# Patient Record
Sex: Male | Born: 1950 | Race: Black or African American | Hispanic: No | Marital: Single | Smoking: Former smoker
Health system: Southern US, Community
[De-identification: ages and names within clinical notes are randomized; demographics above are authoritative.]

## PROBLEM LIST (undated history)

## (undated) DIAGNOSIS — Z720 Tobacco use: Secondary | ICD-10-CM

## (undated) DIAGNOSIS — T7840XA Allergy, unspecified, initial encounter: Secondary | ICD-10-CM

## (undated) DIAGNOSIS — R7303 Prediabetes: Secondary | ICD-10-CM

## (undated) DIAGNOSIS — N400 Enlarged prostate without lower urinary tract symptoms: Secondary | ICD-10-CM

## (undated) DIAGNOSIS — I10 Essential (primary) hypertension: Secondary | ICD-10-CM

## (undated) HISTORY — DX: Benign prostatic hyperplasia without lower urinary tract symptoms: N40.0

## (undated) HISTORY — DX: Allergy, unspecified, initial encounter: T78.40XA

## (undated) HISTORY — DX: Prediabetes: R73.03

## (undated) HISTORY — DX: Tobacco use: Z72.0

---

## 2015-10-19 ENCOUNTER — Encounter (HOSPITAL_COMMUNITY): Payer: Self-pay | Admitting: Emergency Medicine

## 2015-10-19 ENCOUNTER — Observation Stay (HOSPITAL_COMMUNITY)
Admission: EM | Admit: 2015-10-19 | Discharge: 2015-10-20 | Disposition: A | Discharge status: Home / Self Care | Payer: Self-pay | Attending: Internal Medicine | Admitting: Internal Medicine

## 2015-10-19 ENCOUNTER — Emergency Department (HOSPITAL_COMMUNITY): Payer: Self-pay

## 2015-10-19 DIAGNOSIS — F17210 Nicotine dependence, cigarettes, uncomplicated: Secondary | ICD-10-CM | POA: Insufficient documentation

## 2015-10-19 DIAGNOSIS — I10 Essential (primary) hypertension: Secondary | ICD-10-CM | POA: Insufficient documentation

## 2015-10-19 DIAGNOSIS — N289 Disorder of kidney and ureter, unspecified: Secondary | ICD-10-CM | POA: Insufficient documentation

## 2015-10-19 DIAGNOSIS — N3000 Acute cystitis without hematuria: Secondary | ICD-10-CM | POA: Insufficient documentation

## 2015-10-19 DIAGNOSIS — R55 Syncope and collapse: Principal | ICD-10-CM | POA: Insufficient documentation

## 2015-10-19 DIAGNOSIS — R000 Tachycardia, unspecified: Secondary | ICD-10-CM | POA: Insufficient documentation

## 2015-10-19 HISTORY — DX: Essential (primary) hypertension: I10

## 2015-10-19 LAB — CBC WITH DIFFERENTIAL/PLATELET
Basophils Absolute: 0 10*3/uL (ref 0.0–0.1)
Basophils Relative: 0 %
EOS ABS: 0 10*3/uL (ref 0.0–0.7)
EOS PCT: 1 %
HCT: 36.3 % — ABNORMAL LOW (ref 39.0–52.0)
Hemoglobin: 12.2 g/dL — ABNORMAL LOW (ref 13.0–17.0)
LYMPHS ABS: 0.9 10*3/uL (ref 0.7–4.0)
Lymphocytes Relative: 11 %
MCH: 30 pg (ref 26.0–34.0)
MCHC: 33.6 g/dL (ref 30.0–36.0)
MCV: 89.4 fL (ref 78.0–100.0)
MONO ABS: 0.4 10*3/uL (ref 0.1–1.0)
MONOS PCT: 6 %
Neutro Abs: 6.6 10*3/uL (ref 1.7–7.7)
Neutrophils Relative %: 83 %
PLATELETS: 128 10*3/uL — AB (ref 150–400)
RBC: 4.06 MIL/uL — AB (ref 4.22–5.81)
RDW: 14.5 % (ref 11.5–15.5)
WBC: 8 10*3/uL (ref 4.0–10.5)

## 2015-10-19 LAB — COMPREHENSIVE METABOLIC PANEL
ALK PHOS: 76 U/L (ref 38–126)
ALT: 54 U/L (ref 17–63)
AST: 120 U/L — AB (ref 15–41)
Albumin: 4 g/dL (ref 3.5–5.0)
Anion gap: 15 (ref 5–15)
BUN: 20 mg/dL (ref 6–20)
CALCIUM: 8.9 mg/dL (ref 8.9–10.3)
CO2: 27 mmol/L (ref 22–32)
CREATININE: 1.8 mg/dL — AB (ref 0.61–1.24)
Chloride: 96 mmol/L — ABNORMAL LOW (ref 101–111)
GFR calc non Af Amer: 38 mL/min — ABNORMAL LOW (ref >60)
GFR, EST AFRICAN AMERICAN: 44 mL/min — AB (ref >60)
Glucose, Bld: 147 mg/dL — ABNORMAL HIGH (ref 65–99)
Potassium: 3 mmol/L — ABNORMAL LOW (ref 3.5–5.1)
SODIUM: 138 mmol/L (ref 135–145)
Total Bilirubin: 1.2 mg/dL (ref 0.3–1.2)
Total Protein: 7.8 g/dL (ref 6.5–8.1)

## 2015-10-19 LAB — RAPID URINE DRUG SCREEN, HOSP PERFORMED
Amphetamines: NOT DETECTED
Barbiturates: NOT DETECTED
Benzodiazepines: NOT DETECTED
Cocaine: NOT DETECTED
OPIATES: NOT DETECTED
Tetrahydrocannabinol: NOT DETECTED

## 2015-10-19 LAB — URINALYSIS, ROUTINE W REFLEX MICROSCOPIC
Glucose, UA: NEGATIVE mg/dL
Ketones, ur: 15 mg/dL — AB
NITRITE: POSITIVE — AB
PH: 5 (ref 5.0–8.0)
Protein, ur: 100 mg/dL — AB
SPECIFIC GRAVITY, URINE: 1.027 (ref 1.005–1.030)

## 2015-10-19 LAB — URINE MICROSCOPIC-ADD ON

## 2015-10-19 LAB — TROPONIN I: Troponin I: <0.03 ng/mL (ref <0.031)

## 2015-10-19 MED ORDER — SODIUM CHLORIDE 0.9 % IV BOLUS (SEPSIS)
500.0000 mL | Freq: Once | INTRAVENOUS | Status: AC
  Administered 2015-10-19: 500 mL via INTRAVENOUS

## 2015-10-19 MED ORDER — DEXTROSE 5 % IV SOLN
1.0000 g | Freq: Once | INTRAVENOUS | Status: AC
  Administered 2015-10-19: 1 g via INTRAVENOUS
  Filled 2015-10-19: qty 10

## 2015-10-19 MED ORDER — POTASSIUM CHLORIDE CRYS ER 20 MEQ PO TBCR
40.0000 meq | EXTENDED_RELEASE_TABLET | Freq: Once | ORAL | Status: AC
  Administered 2015-10-19: 40 meq via ORAL
  Filled 2015-10-19: qty 2

## 2015-10-19 MED ORDER — SODIUM CHLORIDE 0.9 % IV BOLUS (SEPSIS)
1000.0000 mL | Freq: Once | INTRAVENOUS | Status: AC
  Administered 2015-10-19: 1000 mL via INTRAVENOUS

## 2015-10-19 NOTE — ED Notes (Signed)
Pt could not provide a sample on second try

## 2015-10-19 NOTE — H&P (Signed)
Date: 10/19/2015               Patient Name:  Patrick Thompson MRN: [id number]  DOB: 1951/03/06 Age / Sex: 65 y.o., male   PCP: No Pcp Per Patient         Medical Service: Internal Medicine Teaching Service         Attending Physician: Dr. Davonna Belling, MD    First Contact: Dr. Liberty Handy Pager: [phone]  Second Contact: Dr. Julious Oka Pager: [phone]       After Hours (After 5p/  First Contact Pager: [phone]  weekends / holidays): Second Contact Pager: [phone]   Chief Complaint: "I passed out."  History of Present Illness:  Patrick Thompson is a 65 year old man with no medical problems here with pre-syncope.  Earlier today while waiting in line at [employer], he felt himself becoming lightheaded, tried to make it to a chair, and was eased down by another customer. He never fully lost consciousness but did feel very lightheaded. He denied any preceding chest pain, shortness of breath, palpitations, or cough. He further denies any stable angina, dyspnea on exertion, or history of seizures. He says his right flank started hurting today after his syncopal episode but he denies fevers, dysuria, urgency, or frequency; review of systems is otherwise non-revealing.  Meds: Current Facility-Administered Medications  Medication Dose Route Frequency Provider Last Rate Last Dose  . cefTRIAXone (ROCEPHIN) 1 g in dextrose 5 % 50 mL IVPB  1 g Intravenous Once Davonna Belling, MD 100 mL/hr at 10/19/15 2214 1 g at 10/19/15 2214   No current outpatient prescriptions on file.    Allergies: Allergies as of 10/19/2015  . (No Known Allergies)   Past Medical History  Diagnosis Date  . Hypertension     No longer on medication   History reviewed. No pertinent past surgical history. Family History  Problem Relation Age of Onset  . Hypertension Mother   . Cancer Father     Prostate   . Heart disease Brother     55 brother, MI in late 59s   Social History   Social History  . Marital  Status: Single    Spouse Name: N/A  . Number of Children: N/A  . Years of Education: N/A   Occupational History  . Not on file.   Social History Main Topics  . Smoking status: Current Some Day Smoker -- 0.10 packs/day for 40 years    Types: Cigarettes  . Smokeless tobacco: Not on file  . Alcohol Use: 0.0 oz/week    0 Standard drinks or equivalent per week     Comment: occasionally  . Drug Use: No  . Sexual Activity: Not on file   Other Topics Concern  . Not on file   Social History Narrative   Lives in [place]: Per HPI  Physical Exam: Blood pressure 130/96, pulse 87, temperature 98.7 F (37.1 C), temperature source Oral, resp. rate 22, SpO2 98 %.  Orthostatics not obtained General: friendly man resting in bed comfortably, appropriately conversational HEENT: no scleral icterus, extra-ocular muscles intact, oropharynx without lesions Cardiac: regular rate and rhythm, no rubs, murmurs or gallops Pulm: breathing well, clear to auscultation bilaterally Abd: bowel sounds normal, soft, nondistended, non-tender, CVA tenderness on right side Ext: warm and well perfused, without pedal edema, no hair on lower extremities with 1+ DPs bilaterally Lymph: no cervical or supraclavicular lymphadenopathy Skin: no rash, hair, or nail changes Neuro: alert  and oriented X3, cranial nerves II-XII grossly intact, moving all extremities well, action tremor bilatearlly  Lab results: Basic Metabolic Panel:  Recent Labs  10/19/15 1859  NA 138  K 3.0*  CL 96*  CO2 27  GLUCOSE 147*  BUN 20  CREATININE 1.80*  CALCIUM 8.9   Liver Function Tests:  Recent Labs  10/19/15 1859  AST 120*  ALT 54  ALKPHOS 76  BILITOT 1.2  PROT 7.8  ALBUMIN 4.0   CBC:  Recent Labs  10/19/15 1859  WBC 8.0  NEUTROABS 6.6  HGB 12.2*  HCT 36.3*  MCV 89.4  PLT 128*   Cardiac Enzymes:  Recent Labs  10/19/15 1859  TROPONINI <0.03   Urine Drug Screen: Drugs of Abuse       Component Value Date/Time   LABOPIA NONE DETECTED 10/19/2015 2119   COCAINSCRNUR NONE DETECTED 10/19/2015 2119   LABBENZ NONE DETECTED 10/19/2015 2119   AMPHETMU NONE DETECTED 10/19/2015 2119   THCU NONE DETECTED 10/19/2015 2119   LABBARB NONE DETECTED 10/19/2015 2119    Urinalysis:  Recent Labs  10/19/15 2105  COLORURINE AMBER*  LABSPEC 1.027  PHURINE 5.0  GLUCOSEU NEGATIVE  HGBUR SMALL*  BILIRUBINUR LARGE*  KETONESUR 15*  PROTEINUR 100*  NITRITE POSITIVE*  LEUKOCYTESUR SMALL*    Imaging results:  Dg Chest 2 View  10/19/2015  CLINICAL DATA:  Syncope. EXAM: CHEST  2 VIEW COMPARISON:  None. FINDINGS: Both lungs are clear. Heart and mediastinum are within normal limits. The trachea is midline. No acute bone abnormality. No pleural effusions. Evidence for scarring at the lung apices. IMPRESSION: No active cardiopulmonary disease. Electronically Signed   By: Markus Daft M.D.   On: 10/19/2015 19:41   Other results: EKG: normal sinus rhythm with no ischemic changes  Assessment & Plan by Problem:  Patrick Thompson is a 65 year old man with no medical problems here with pre-syncope and a complicated urinary tract infection.  Pre-syncope: This appears to be simple orthostatic hypotension probably related to his urinary tract infection. I doubt myocardial infarction, arrhythmia, valvular defect, or seizure. -Bolused 1.5L in ED -Started NS at 111mL/hr -Telemetry overnight  Complicated urinary tract infection: He complains of dull right-sided flank pain with CVA tenderness but denies fevers, dysuria, urgency, or frequency. His urinalysis does show bacteriuria but I have to wonder whether it is clinically significant. Given his flank pain, pre-syncope, and the fact that he is a male, we will err on the side of treatment for a 7 day course. -Received IV ceftriaxone in the ED -Can consider started ciprofloxacin for a 5-7 day course upon discharge  Normocytic anemia: He hemoglobin is 12.2  with a normal MCV; there are no prior CBCs to compare. He denies melena nor hematochezia. Since he is 65, he should get a colonoscopy on an outpatient basis. -Will need outpatient colonoscopy  Decreased renal function: His creatinine is 1.8 with normal BUN; it's hard to say whether this is chronic kidney disease or pre-renal azotemia so we will check another BMP in the morning after he was re-hydrated. -BMP in the morning  Claudication, Rutherford stage 2: He complains of claudication when walking up one flight of stairs, has hairless lower extremities, and weak posterior tibial pulses. -Recommend outpatient ABIs  Transaminitis: His AST is 120, ALT 54, likely from alcohol, although he only drinks about 5 drinks weekly. -Follow-up CMP on outpatient basis -Can consider screening for HCV  Tobacco abuse: He smokes 1 cigarette daily for the last 40 years. -  Advised total cessation  Hypokalemia: Potassium was 3.0, likely from dehydration, and was replaced. 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  Dispo: Disposition is deferred at this time, awaiting improvement of current medical problems. Anticipated discharge in approximately 1 day(s).   The patient does not have a current PCP (No Pcp Per Patient) and does need an [facility] hospital follow-up appointment after discharge; I think he would be a good patient for our clinic downstairs.  The patient does have transportation limitations that hinder transportation to clinic appointments.  Signed: Loleta Chance, MD 10/19/2015, 10:23 PM

## 2015-10-19 NOTE — ED Notes (Signed)
Pt to ER BIB [facility] after witnessed syncopal episode while standing in check out line at [employer]. Pt was caught before hitting the ground. Pt denies pain at this time but does report that he was nauseous before syncopal episode. Pt reported to be confused by EMS. Pt answering all questions appropriately at present. Pt noted to be tachycardic at 110. Denies chest pain, denies shortness of breath. NAD.

## 2015-10-19 NOTE — ED Provider Notes (Signed)
CSN: [id number]     Arrival date & time 10/19/15  1806 History   First MD Initiated Contact with Patient 10/19/15 1820     Chief Complaint  Patient presents with  . Loss of Consciousness      Patient is a 65 y.o. male presenting with syncope. The history is provided by the patient.  Loss of Consciousness Associated symptoms: nausea   Associated symptoms: no chest pain, no headaches, no shortness of breath and no weakness   Patient had a syncopal episode. States he was at the grocery store and does not really remember much of it. Per EMS notes he was at the checkout line and passed out. Patient states he has felt nauseous. He has not eaten much today. No chest pain. No trouble breathing. No headache. Did not hit his head. It is not really remember the event. States he feels better now.  Past Medical History  Diagnosis Date  . Hypertension     No longer on medication   History reviewed. No pertinent past surgical history. Family History  Problem Relation Age of Onset  . Hypertension Mother   . Cancer Father     Prostate   . Heart disease Brother     32 brother, MI in late 39s   Social History  Substance Use Topics  . Smoking status: Current Some Day Smoker -- 0.10 packs/day for 40 years    Types: Cigarettes  . Smokeless tobacco: None  . Alcohol Use: 0.0 oz/week    0 Standard drinks or equivalent per week     Comment: occasionally    Review of Systems  Constitutional: Negative for activity change, appetite change and fatigue.  Eyes: Negative for pain.  Respiratory: Negative for chest tightness and shortness of breath.   Cardiovascular: Positive for syncope. Negative for chest pain and leg swelling.  Gastrointestinal: Positive for nausea. Negative for abdominal pain and diarrhea.  Genitourinary: Negative for flank pain.  Musculoskeletal: Negative for back pain and neck stiffness.  Skin: Negative for rash.  Neurological: Positive for syncope. Negative for weakness,  numbness and headaches.  Psychiatric/Behavioral: Negative for behavioral problems.      Allergies  Review of patient's allergies indicates no known allergies.  Home Medications   Prior to Admission medications   Not on File   BP 136/98 mmHg  Pulse 90  Temp(Src) 98.7 F (37.1 C) (Oral)  Resp 15  SpO2 96% Physical Exam  Constitutional: He appears well-developed.  HENT:  Head: Atraumatic.  Eyes: EOM are normal.  Neck: Neck supple.  Cardiovascular:  Mild tachycardia  Pulmonary/Chest: Effort normal.  Abdominal: Soft. There is no tenderness.  Neurological: He is alert.  Patient is awake and pleasant, but mildly confused to the month.  Skin: Skin is warm.  Psychiatric: He has a normal mood and affect.    ED Course  Procedures (including critical care time) Labs Review Labs Reviewed  COMPREHENSIVE METABOLIC PANEL - Abnormal; Notable for the following:    Potassium 3.0 (*)    Chloride 96 (*)    Glucose, Bld 147 (*)    Creatinine, Ser 1.80 (*)    AST 120 (*)    GFR calc non Af Amer 38 (*)    GFR calc Af Amer 44 (*)    All other components within normal limits  CBC WITH DIFFERENTIAL/PLATELET - Abnormal; Notable for the following:    RBC 4.06 (*)    Hemoglobin 12.2 (*)    HCT 36.3 (*)  Platelets 128 (*)    All other components within normal limits  URINALYSIS, ROUTINE W REFLEX MICROSCOPIC (NOT AT [facility]) - Abnormal; Notable for the following:    Color, Urine AMBER (*)    APPearance CLOUDY (*)    Hgb urine dipstick SMALL (*)    Bilirubin Urine LARGE (*)    Ketones, ur 15 (*)    Protein, ur 100 (*)    Nitrite POSITIVE (*)    Leukocytes, UA SMALL (*)    All other components within normal limits  URINE MICROSCOPIC-ADD ON - Abnormal; Notable for the following:    Squamous Epithelial / LPF 0-5 (*)    Bacteria, UA MANY (*)    Casts HYALINE CASTS (*)    All other components within normal limits  URINE CULTURE  TROPONIN I  URINE RAPID DRUG SCREEN, HOSP PERFORMED     Imaging Review Dg Chest 2 View  10/19/2015  CLINICAL DATA:  Syncope. EXAM: CHEST  2 VIEW COMPARISON:  None. FINDINGS: Both lungs are clear. Heart and mediastinum are within normal limits. The trachea is midline. No acute bone abnormality. No pleural effusions. Evidence for scarring at the lung apices. IMPRESSION: No active cardiopulmonary disease. Electronically Signed   By: Markus Daft M.D.   On: 10/19/2015 19:41   I have personally reviewed and evaluated these images and lab results as part of my medical decision-making.   EKG Interpretation   Date/Time:  Wednesday October 19 2015 18:12:34 EDT Ventricular Rate:  107 PR Interval:  160 QRS Duration: 79 QT Interval:  324 QTC Calculation: 432 R Axis:   68 Text Interpretation:  Sinus tachycardia Borderline repolarization  abnormality Confirmed by Alvino Chapel  MD, Ovid Curd [phone]) on 10/19/2015  6:33:38 PM      MDM   Final diagnoses:  Syncope, unspecified syncope type  Renal insufficiency  Acute cystitis without hematuria    Patient was syncope. Also renal insufficiency. Unsure of baseline kidney function. Found also to have urinary tract infection. Will admit for further evaluation treatment.    Davonna Belling, MD 10/20/15 [phone]

## 2015-10-19 NOTE — ED Notes (Signed)
Pt tried and stated he could not urinate. Will attempt again to collect sample

## 2015-10-19 NOTE — ED Notes (Signed)
Patient transported to X-ray 

## 2015-10-19 NOTE — ED Notes (Signed)
MDs at bedside

## 2015-10-20 DIAGNOSIS — R55 Syncope and collapse: Secondary | ICD-10-CM

## 2015-10-20 DIAGNOSIS — F1010 Alcohol abuse, uncomplicated: Secondary | ICD-10-CM

## 2015-10-20 DIAGNOSIS — R740 Nonspecific elevation of levels of transaminase and lactic acid dehydrogenase [LDH]: Secondary | ICD-10-CM

## 2015-10-20 DIAGNOSIS — N179 Acute kidney failure, unspecified: Secondary | ICD-10-CM

## 2015-10-20 DIAGNOSIS — I951 Orthostatic hypotension: Secondary | ICD-10-CM

## 2015-10-20 DIAGNOSIS — E876 Hypokalemia: Secondary | ICD-10-CM

## 2015-10-20 HISTORY — DX: Syncope and collapse: R55

## 2015-10-20 LAB — BASIC METABOLIC PANEL
ANION GAP: 11 (ref 5–15)
BUN: 20 mg/dL (ref 6–20)
CALCIUM: 7.9 mg/dL — AB (ref 8.9–10.3)
CO2: 26 mmol/L (ref 22–32)
Chloride: 101 mmol/L (ref 101–111)
Creatinine, Ser: 1.28 mg/dL — ABNORMAL HIGH (ref 0.61–1.24)
GFR calc Af Amer: >60 mL/min (ref >60)
GFR, EST NON AFRICAN AMERICAN: 58 mL/min — AB (ref >60)
GLUCOSE: 104 mg/dL — AB (ref 65–99)
Potassium: 3.1 mmol/L — ABNORMAL LOW (ref 3.5–5.1)
Sodium: 138 mmol/L (ref 135–145)

## 2015-10-20 LAB — ETHANOL

## 2015-10-20 LAB — MAGNESIUM: Magnesium: 0.8 mg/dL — CL (ref 1.7–2.4)

## 2015-10-20 MED ORDER — SODIUM CHLORIDE 0.9% FLUSH: 3.0000 mL | Freq: Two times a day (BID) | INTRAVENOUS | Status: DC

## 2015-10-20 MED ORDER — POTASSIUM CHLORIDE CRYS ER 20 MEQ PO TBCR
40.0000 meq | EXTENDED_RELEASE_TABLET | Freq: Two times a day (BID) | ORAL | Status: DC
  Administered 2015-10-20: 40 meq via ORAL
  Filled 2015-10-20: qty 2

## 2015-10-20 MED ORDER — MAGNESIUM SULFATE 4 GM/100ML IV SOLN
4.0000 g | Freq: Once | INTRAVENOUS | Status: AC
  Administered 2015-10-20: 4 g via INTRAVENOUS
  Filled 2015-10-20: qty 100

## 2015-10-20 MED ORDER — DICLOFENAC SODIUM 1 % TD GEL
2.0000 g | Freq: Four times a day (QID) | TRANSDERMAL | Status: DC
  Filled 2015-10-20 (×2): qty 100

## 2015-10-20 MED ORDER — SODIUM CHLORIDE 0.9 % IV SOLN
INTRAVENOUS | Status: DC
  Administered 2015-10-20: 12:00:00 via INTRAVENOUS

## 2015-10-20 MED ORDER — ENOXAPARIN SODIUM 40 MG/0.4ML ~~LOC~~ SOLN
40.0000 mg | SUBCUTANEOUS | Status: DC
  Filled 2015-10-20: qty 0.4

## 2015-10-20 MED ORDER — DICLOFENAC SODIUM 1 % TD GEL: 2.0000 g | Freq: Four times a day (QID) | TRANSDERMAL | Status: DC

## 2015-10-20 NOTE — Discharge Planning (Signed)
Chakita Mcgraw J. Clydene Laming, RN, BSN, [place] [phone] [facility] set up appointment with Cammie Sickle, FNP on 7/3 @ 0900.  Pt verbalizes understanding of keeping appointment.

## 2015-10-20 NOTE — Progress Notes (Signed)
Subjective:  Mr. Yoffe says he feels much better this morning. He denies any light-headedness. He added some history, saying that he in fact felt light-headed and lost consciousness. He believes his right sided flank pain is due to his fall and is worse when he twists his back. He continues to deny dysuria or any acute changes in urinary frequency.  He reports that he had been drinking gin the day.  He says his PCP died 6 months ago, and he does not regularly follow with a doctor.  Objective: Vital signs in last 24 hours: Filed Vitals:   10/20/15 0700 10/20/15 0730 10/20/15 0800 10/20/15 0830  BP: 153/97 144/105 158/109 175/111  Pulse: 76 73 72 87  Temp:      TempSrc:      Resp: 20 17 13 14   SpO2: 98% 99% 96% 97%   Weight change:  No intake or output data in the 24 hours ending 10/20/15 0912 General: Sitting up in bed, NAD HEENT: MMM. Neck supple Cardiovascular: RRR, no m/r/g Pulmonary: CTAB, unlabored breathing Abdominal: Soft. NT/ND. MSK: Paraspinal tenderness on right. No atrophy Neurological: Tremulous. Normal FTN  Lab Results: Basic Metabolic Panel:  Recent Labs Lab 10/19/15 1859 10/20/15 0736  NA 138 138  K 3.0* 3.1*  CL 96* 101  CO2 27 26  GLUCOSE 147* 104*  BUN 20 20  CREATININE 1.80* 1.28*  CALCIUM 8.9 7.9*   Liver Function Tests:  Recent Labs Lab 10/19/15 1859  AST 120*  ALT 54  ALKPHOS 76  BILITOT 1.2  PROT 7.8  ALBUMIN 4.0   CBC:  Recent Labs Lab 10/19/15 1859  WBC 8.0  NEUTROABS 6.6  HGB 12.2*  HCT 36.3*  MCV 89.4  PLT 128*   Cardiac Enzymes:  Recent Labs Lab 10/19/15 1859  TROPONINI <0.03   Urine Drug Screen: Drugs of Abuse     Component Value Date/Time   LABOPIA NONE DETECTED 10/19/2015 2119   COCAINSCRNUR NONE DETECTED 10/19/2015 2119   LABBENZ NONE DETECTED 10/19/2015 2119   AMPHETMU NONE DETECTED 10/19/2015 2119   THCU NONE DETECTED 10/19/2015 2119   LABBARB NONE DETECTED 10/19/2015 2119    Alcohol  Level: No results for input(s): ETH in the last 168 hours. Urinalysis:  Recent Labs Lab 10/19/15 2105  COLORURINE AMBER*  LABSPEC 1.027  PHURINE 5.0  GLUCOSEU NEGATIVE  HGBUR SMALL*  BILIRUBINUR LARGE*  KETONESUR 15*  PROTEINUR 100*  NITRITE POSITIVE*  LEUKOCYTESUR SMALL*    Micro Results: No results found for this or any previous visit (from the past 240 hour(s)). Studies/Results: Dg Chest 2 View  10/19/2015  CLINICAL DATA:  Syncope. EXAM: CHEST  2 VIEW COMPARISON:  None. FINDINGS: Both lungs are clear. Heart and mediastinum are within normal limits. The trachea is midline. No acute bone abnormality. No pleural effusions. Evidence for scarring at the lung apices. IMPRESSION: No active cardiopulmonary disease. Electronically Signed   By: Markus Daft M.D.   On: 10/19/2015 19:41   Medications: I have reviewed the patient's current medications. Scheduled Meds: . enoxaparin (LOVENOX) injection  40 mg Subcutaneous Q24H  . potassium chloride  40 mEq Oral BID  . sodium chloride flush  3 mL Intravenous Q12H   Continuous Infusions: . sodium chloride     PRN Meds:. Assessment/Plan:  Orthostatic Syncope: Patient had been drinking alcohol and was likely overheated and dehydrated (given AKI).Marland Kitchen He indicated that had had some prodromal light-headedness. While he has negative orthostatic vital signs, he has already received 1.5L fluids.  Low suspicion for cardiac etiology or seizure. He is feeling much better. Low suspicion for active UTI given he is asymptomatic. We will continue to hydrate him with NS, and he appropriate for discharge today. - IV NS 100 cc/hr  AKI: Cr downtrending from 1.8 to 1.28. Baseline is unclear. Likely prerenal. - IV NS 100 cc/hr  Hypokalemia: 3.1 today despite repletion. - Check mag - 40 mEq Potassium BID  HTN: Not on medications. BP remains elevate. He needs a PCP to manage this problem as an outpatient. - Care mgmt referral  Transaminitis: AST 120, ALT 54.  C/w alcohol use. - F/u as an outpatient  DVT PPx: Lovenox [facility]   Dispo: Anticipated discharge today or tomorrow, depending on whether is improvement is sustained.  The patient does not have a current PCP (No Pcp Per Patient) and does need an [facility] hospital follow-up appointment after discharge.  The patient does have transportation limitations that hinder transportation to clinic appointments.  .Services Needed at time of discharge: Y = Yes, Blank = No PT:   OT:   RN:   Equipment:   Other:       Liberty Handy, MD 10/20/2015, 9:12 AM

## 2015-10-20 NOTE — Care Management Note (Signed)
Case Management Note  Patient Details  Name: Patrick Thompson MRN: [id number] Date of Birth: 04-02-1951  Subjective/Objective:                  66 year old man with no medical problems here with pre-syncope and a complicated urinary tract infection./ From home alone; non-insured; no PCP.   Action/Plan: Follow for disposition needs./ Anticipate setting up follow-up appointment at [facility] care clinic and/or provide resources to obtain PCP. Pt may also be eligible for [facility] program.   Expected Discharge Date:  10/21/15               Expected Discharge Plan:  Home/Self Care  In-House Referral:     Discharge planning Services  CM Consult  Post Acute Care Choice:    Choice offered to:     DME Arranged:    DME Agency:     HH Arranged:    HH Agency:     Status of Service:  In process, will continue to follow  Medicare Important Message Given:    Date Medicare IM Given:    Medicare IM give by:    Date Additional Medicare IM Given:    Additional Medicare Important Message give by:     If discussed at [place] of Stay Meetings, dates discussed:    Additional Comments:  Fuller Mandril, RN 10/20/2015, 8:45 AM

## 2015-10-20 NOTE — Discharge Instructions (Signed)
Patrick Thompson,  It was a pleasure taking care of you in the hospital.  You were likely a little dehydrated yesterday. Please be sure to drink PLENTY of WATER. In general, you should avoid alcohol, but if you drink every day, you should NOT stop all of a sudden due to risk of withdrawal.  The Case Manager will be setting you up with a new primary doctor. This doctor can monitor your kidney function and blood pressure.

## 2015-10-20 NOTE — ED Notes (Signed)
Attempted report x1. 

## 2015-10-20 NOTE — Progress Notes (Signed)
Called Internal Medicine Resident to clarify if they wanted pt to have repeat mag level drawn or a script for P,O. Antibiotics.  No new orders received and pt will be discharged home.

## 2015-10-20 NOTE — Discharge Summary (Signed)
Name: Patrick Thompson MRN: [id number] DOB: [birth year] 65 y.o. PCP: No Pcp Per Patient  Date of Admission: 10/19/2015  6:06 PM Date of Discharge: 10/20/2015 Attending Physician: Sid Falcon, MD  Discharge Diagnosis: 1. Orthostatic Syncope 2. Alcohol Use 3. Acute Kidney Injury 4. Elevated Liver Enzymes 5. Hypomagnesemia 6. Hypokalemia  Discharge Medications:   Medication List    Notice    You have not been prescribed any medications.      Disposition and follow-up:   Mr.Patrick Thompson was discharged from [facility] in stable condition.  At the hospital follow up visit please address:  1.  Patient found to have vasovagal versus orthostatic syncope. He improved with fluids. EtOH may have been a contributing factor. Please assess for recurrent of symptoms and status of alcohol usage. Consider TTE as an outpatient.   2.  Patient had low magnesium to 0.8 and hypokalemia as well. Likely due to malnutrition in setting of EtOH usage. Labs needed at outpatient followu-up  3. Patient found to have elevated liver enzymes in ratio consistent with alcohol use. Would also benefit from HIV/HCV as outpatient  4.   Labs / imaging needed at time of follow-up: HIV, HCV, CMET (K, MG, AST, ALT)  5.  Pending labs/ test needing follow-up: Urine Culture  Follow-up Appointments:  Follow-up Information    Follow up with Dorena Dew, FNP On 11/07/2015.   Specialty:  Family Medicine   Why:  ER Follow-up, PCP establishment, [id number]   Contact information:   [address] [address] [phone]       Discharge Instructions:     Discharge Instructions    Diet - low sodium heart healthy    Complete by:  As directed      Increase activity slowly    Complete by:  As directed           Mr. [name],  It was a pleasure taking care of you in the hospital.  You were likely a little dehydrated yesterday. Please be sure to drink PLENTY of WATER.  In general, you should avoid alcohol, but if you drink every day, you should NOT stop all of a sudden due to risk of withdrawal.  The Case Manager will be setting you up with a new primary doctor. This doctor can monitor your kidney function and blood pressure.  Consultations:    Procedures Performed:  Dg Chest 2 View  10/19/2015  CLINICAL DATA:  Syncope. EXAM: CHEST  2 VIEW COMPARISON:  None. FINDINGS: Both lungs are clear. Heart and mediastinum are within normal limits. The trachea is midline. No acute bone abnormality. No pleural effusions. Evidence for scarring at the lung apices. IMPRESSION: No active cardiopulmonary disease. Electronically Signed   By: Markus Daft M.D.   On: 10/19/2015 19:41    Admission HPI: Mr. Patrick Thompson is a 65 year old man with no medical problems here with pre-syncope.  Earlier today while waiting in line at [employer], he felt himself becoming lightheaded, tried to make it to a chair, and was eased down by another customer. He never fully lost consciousness but did feel very lightheaded. He denied any preceding chest pain, shortness of breath, palpitations, or cough. He further denies any stable angina, dyspnea on exertion, or history of seizures. He says his right flank started hurting today after his syncopal episode but he denies fevers, dysuria, urgency, or frequency; review of systems is otherwise non-revealing.  Hospital Course by problem list:  Orthostasis versus Vasovagal syncope: Patient improved dramatically with fluid resuscitation. He was found to have an AKI with Cr to 1.8 that improved to 1.28. He denied any light-headedness or vertigo by the day after admission. EKG and telemetry were reassuring; therefore low suspicion for a cardiogenic cause. There were no signs of symptoms of pulmonary embolism. No apparent seizure-like activity, and the patient had apparently had gin on the day of his syncope, therefore alcohol withdrawal seizure was less likely. At first,  a UTI was thought to have been a contributor given his positive UA, but he denied any dysuria or acute changes in urinary frequency. After fluid resuscitation, orthostatic vital signs were negative. Given his dramatic improvement, he was stable for discharge.  Hypokalemia/Hypomagnesemia: Was found to be hypokalemic with Mg of 0.8. This was replenished with 4 g magnesium IV. This was thought to be due to alcohol use.   Elevated Liver Enzymes: AST 120, ALT 54, consistent with an alcohol use.  Alcohol Use: Patient says that he only drinks 5 drinks a week, although his laboratory values suggest otherwise. He had a tremor on exam, although this may have been due to essential tremor.  Discharge Vitals:   BP 162/109 mmHg  Pulse 74  Temp(Src) 98.7 F (37.1 C) (Oral)  Resp 21  SpO2 97%  Discharge Labs:  Results for orders placed or performed during the hospital encounter of 10/19/15 (from the past 24 hour(s))  Comprehensive metabolic panel     Status: Abnormal   Collection Time: 10/19/15  6:59 PM  Result Value Ref Range   Sodium 138 135 - 145 mmol/L   Potassium 3.0 (L) 3.5 - 5.1 mmol/L   Chloride 96 (L) 101 - 111 mmol/L   CO2 27 22 - 32 mmol/L   Glucose, Bld 147 (H) 65 - 99 mg/dL   BUN 20 6 - 20 mg/dL   Creatinine, Ser 1.80 (H) 0.61 - 1.24 mg/dL   Calcium 8.9 8.9 - 10.3 mg/dL   Total Protein 7.8 6.5 - 8.1 g/dL   Albumin 4.0 3.5 - 5.0 g/dL   AST 120 (H) 15 - 41 U/L   ALT 54 17 - 63 U/L   Alkaline Phosphatase 76 38 - 126 U/L   Total Bilirubin 1.2 0.3 - 1.2 mg/dL   GFR calc non Af Amer 38 (L) >60 mL/min   GFR calc Af Amer 44 (L) >60 mL/min   Anion gap 15 5 - 15  Troponin I     Status: None   Collection Time: 10/19/15  6:59 PM  Result Value Ref Range   Troponin I <0.03 <0.031 ng/mL  CBC with Differential     Status: Abnormal   Collection Time: 10/19/15  6:59 PM  Result Value Ref Range   WBC 8.0 4.0 - 10.5 K/uL   RBC 4.06 (L) 4.22 - 5.81 MIL/uL   Hemoglobin 12.2 (L) 13.0 - 17.0 g/dL     HCT 36.3 (L) 39.0 - 52.0 %   MCV 89.4 78.0 - 100.0 fL   MCH 30.0 26.0 - 34.0 pg   MCHC 33.6 30.0 - 36.0 g/dL   RDW 14.5 11.5 - 15.5 %   Platelets 128 (L) 150 - 400 K/uL   Neutrophils Relative % 83 %   Neutro Abs 6.6 1.7 - 7.7 K/uL   Lymphocytes Relative 11 %   Lymphs Abs 0.9 0.7 - 4.0 K/uL   Monocytes Relative 6 %   Monocytes Absolute 0.4 0.1 - 1.0 K/uL   Eosinophils Relative  1 %   Eosinophils Absolute 0.0 0.0 - 0.7 K/uL   Basophils Relative 0 %   Basophils Absolute 0.0 0.0 - 0.1 K/uL  Urinalysis, Routine w reflex microscopic     Status: Abnormal   Collection Time: 10/19/15  9:05 PM  Result Value Ref Range   Color, Urine AMBER (A) YELLOW   APPearance CLOUDY (A) CLEAR   Specific Gravity, Urine 1.027 1.005 - 1.030   pH 5.0 5.0 - 8.0   Glucose, UA NEGATIVE NEGATIVE mg/dL   Hgb urine dipstick SMALL (A) NEGATIVE   Bilirubin Urine LARGE (A) NEGATIVE   Ketones, ur 15 (A) NEGATIVE mg/dL   Protein, ur 100 (A) NEGATIVE mg/dL   Nitrite POSITIVE (A) NEGATIVE   Leukocytes, UA SMALL (A) NEGATIVE  Urine microscopic-add on     Status: Abnormal   Collection Time: 10/19/15  9:05 PM  Result Value Ref Range   Squamous Epithelial / LPF 0-5 (A) NONE SEEN   WBC, UA 6-30 0 - 5 WBC/hpf   RBC / HPF 6-30 0 - 5 RBC/hpf   Bacteria, UA MANY (A) NONE SEEN   Casts HYALINE CASTS (A) NEGATIVE   Urine-Other MUCOUS PRESENT   Urine rapid drug screen (hosp performed)     Status: None   Collection Time: 10/19/15  9:19 PM  Result Value Ref Range   Opiates NONE DETECTED NONE DETECTED   Cocaine NONE DETECTED NONE DETECTED   Benzodiazepines NONE DETECTED NONE DETECTED   Amphetamines NONE DETECTED NONE DETECTED   Tetrahydrocannabinol NONE DETECTED NONE DETECTED   Barbiturates NONE DETECTED NONE DETECTED  Basic metabolic panel     Status: Abnormal   Collection Time: 10/20/15  7:36 AM  Result Value Ref Range   Sodium 138 135 - 145 mmol/L   Potassium 3.1 (L) 3.5 - 5.1 mmol/L   Chloride 101 101 - 111 mmol/L    CO2 26 22 - 32 mmol/L   Glucose, Bld 104 (H) 65 - 99 mg/dL   BUN 20 6 - 20 mg/dL   Creatinine, Ser 1.28 (H) 0.61 - 1.24 mg/dL   Calcium 7.9 (L) 8.9 - 10.3 mg/dL   GFR calc non Af Amer 58 (L) >60 mL/min   GFR calc Af Amer >60 >60 mL/min   Anion gap 11 5 - 15  Magnesium     Status: Abnormal   Collection Time: 10/20/15  9:51 AM  Result Value Ref Range   Magnesium 0.8 (LL) 1.7 - 2.4 mg/dL  Ethanol     Status: None   Collection Time: 10/20/15  9:51 AM  Result Value Ref Range   Alcohol, Ethyl (B) <5 <5 mg/dL    Signed: Liberty Handy, MD 10/20/2015, 1:23 PM

## 2015-10-21 LAB — URINE CULTURE

## 2015-11-07 ENCOUNTER — Ambulatory Visit: Payer: Self-pay | Admitting: Family Medicine

## 2015-12-09 ENCOUNTER — Ambulatory Visit: Payer: Self-pay | Admitting: Family Medicine

## 2016-03-30 ENCOUNTER — Emergency Department: Payer: Self-pay

## 2016-03-30 ENCOUNTER — Emergency Department
Admission: EM | Admit: 2016-03-30 | Discharge: 2016-03-30 | Disposition: A | Discharge status: Home / Self Care | Payer: Self-pay | Attending: Emergency Medicine | Admitting: Emergency Medicine

## 2016-03-30 DIAGNOSIS — I10 Essential (primary) hypertension: Secondary | ICD-10-CM | POA: Insufficient documentation

## 2016-03-30 DIAGNOSIS — F10920 Alcohol use, unspecified with intoxication, uncomplicated: Secondary | ICD-10-CM

## 2016-03-30 DIAGNOSIS — Y939 Activity, unspecified: Secondary | ICD-10-CM | POA: Insufficient documentation

## 2016-03-30 DIAGNOSIS — W1839XA Other fall on same level, initial encounter: Secondary | ICD-10-CM | POA: Insufficient documentation

## 2016-03-30 DIAGNOSIS — F10120 Alcohol abuse with intoxication, uncomplicated: Secondary | ICD-10-CM | POA: Insufficient documentation

## 2016-03-30 DIAGNOSIS — S01112A Laceration without foreign body of left eyelid and periocular area, initial encounter: Secondary | ICD-10-CM | POA: Insufficient documentation

## 2016-03-30 DIAGNOSIS — Y929 Unspecified place or not applicable: Secondary | ICD-10-CM | POA: Insufficient documentation

## 2016-03-30 DIAGNOSIS — F17210 Nicotine dependence, cigarettes, uncomplicated: Secondary | ICD-10-CM | POA: Insufficient documentation

## 2016-03-30 DIAGNOSIS — W19XXXA Unspecified fall, initial encounter: Secondary | ICD-10-CM

## 2016-03-30 DIAGNOSIS — Y999 Unspecified external cause status: Secondary | ICD-10-CM | POA: Insufficient documentation

## 2016-03-30 NOTE — ED Notes (Signed)
Pt bib EMS w/ c/o witnessed fall, +ETOH.  Pt denies LOC.  Pt alert, oriented to self and situation.  Able to move all limbs on command w/o issue.  Pt denies pain.  Pt has laceration to L side of face.  Bleeding controlled.  Denies n/v. PERRLA.  NAD.

## 2016-03-30 NOTE — ED Notes (Signed)
Patient transported to CT 

## 2016-03-30 NOTE — ED Notes (Signed)
Pt returned from CT °

## 2016-03-30 NOTE — ED Provider Notes (Signed)
[facility] Emergency Department Provider Note  Time seen: 6:01 PM  I have reviewed the triage vital signs and the nursing notes.   HISTORY  Chief Complaint Fall    HPI Patrick Thompson is a 65 y.o. male with a past medical history of hypertension, alcohol abuse, presents the emergency department with a head injury. According to the patient he has been drinking "a lot of liquor." EMS states the patient was outside of the gas station, with attempting to get out of a car when he fell forward and hitting his forehead on the ground. EMS stated a bystander denied LOC. Patient denies any complaints. Denies any pain. Does have a small laceration to left eyebrow.  Past Medical History:  Diagnosis Date  . Hypertension    No longer on medication    Patient Active Problem List   Diagnosis Date Noted  . Syncope 10/20/2015    History reviewed. No pertinent surgical history.  Prior to Admission medications   Medication Sig Start Date End Date Taking? Authorizing Provider  diclofenac sodium (VOLTAREN) 1 % GEL Apply 2 g topically 4 (four) times daily. 10/20/15   Norman Herrlich, MD    No Known Allergies  Family History  Problem Relation Age of Onset  . Hypertension Mother   . Cancer Father     Prostate   . Heart disease Brother     61 brother, MI in late 34s    Social History Social History  Substance Use Topics  . Smoking status: Current Some Day Smoker    Packs/day: 0.10    Years: 40.00    Types: Cigarettes  . Smokeless tobacco: Never Used  . Alcohol use 0.0 oz/week     Comment: occasionally    Review of Systems Constitutional: Negative for fever. Cardiovascular: Negative for chest pain. Respiratory: Negative for shortness of breath. Gastrointestinal: Negative for abdominal pain Musculoskeletal: Negative for back pain.Denies neck pain. Skin: Small laceration to left eyebrow Neurological: Negative for headaches, focal weakness or  numbness. 10-point ROS otherwise negative.  ____________________________________________   PHYSICAL EXAM:  Constitutional: Alert. Joking, happy acting. Admits alcohol intoxication. Eyes: Normal exam ENT   Head: Small hematoma with 1 cm laceration to left eyebrow, hemostatic. No C-spine tenderness, c-collar in place currently.   Mouth/Throat: Mucous membranes are moist. Cardiovascular: Normal rate, regular rhythm. No murmur Respiratory: Normal respiratory effort without tachypnea nor retractions. Breath sounds are clear  Gastrointestinal: Soft and nontender. No distention.  Musculoskeletal: Nontender with normal range of motion in all extremities. No signs of trauma on extremities. Neurologic:  Normal speech and language. No gross focal neurologic deficits Skin:  Skin is warm, dry. 1 cm laceration to left eyebrow, hemostatic. Psychiatric: Mood and affect are normal.  ____________________________________________    RADIOLOGY  CT appears negative for acute injury.  ____________________________________________   INITIAL IMPRESSION / ASSESSMENT AND PLAN / ED COURSE  Pertinent labs & imaging results that were available during my care of the patient were reviewed by me and considered in my medical decision making (see chart for details).  Patient presents the emergency department with alcohol intoxication status post fall with a head injury, no reported LOC. We will obtain a head and C-spine CT scan to evaluate. We'll closely monitor in the emergency department. Patient denies any symptoms at this time. We will likely Dermabond a small laceration to his left eyebrow to prevent any rebleeding.  CT negative for acute injury. No cervical spine tenderness. C-collar removed. Dermabond applied to  small laceration area hemostatic. Patient's family is here with him who will be taking him home. Patient agreeable to plan.  ____________________________________________   FINAL CLINICAL  IMPRESSION(S) / ED DIAGNOSES  Alcohol intoxication Head injury Cheral Marker, MD 03/30/16 1909

## 2016-03-30 NOTE — ED Notes (Signed)
Bed alarm placed on pt, fall mats in place

## 2017-04-12 ENCOUNTER — Other Ambulatory Visit: Payer: Self-pay

## 2017-04-12 ENCOUNTER — Encounter (HOSPITAL_COMMUNITY): Payer: Self-pay

## 2017-04-12 ENCOUNTER — Emergency Department (HOSPITAL_COMMUNITY)
Admission: EM | Admit: 2017-04-12 | Discharge: 2017-04-12 | Disposition: A | Discharge status: Home / Self Care | Payer: Medicare Other | Attending: Emergency Medicine | Admitting: Emergency Medicine

## 2017-04-12 DIAGNOSIS — M5441 Lumbago with sciatica, right side: Secondary | ICD-10-CM | POA: Insufficient documentation

## 2017-04-12 DIAGNOSIS — I10 Essential (primary) hypertension: Secondary | ICD-10-CM | POA: Insufficient documentation

## 2017-04-12 DIAGNOSIS — F17210 Nicotine dependence, cigarettes, uncomplicated: Secondary | ICD-10-CM | POA: Diagnosis not present

## 2017-04-12 DIAGNOSIS — M545 Low back pain: Secondary | ICD-10-CM | POA: Diagnosis present

## 2017-04-12 DIAGNOSIS — Z79899 Other long term (current) drug therapy: Secondary | ICD-10-CM | POA: Diagnosis not present

## 2017-04-12 MED ORDER — METHYLPREDNISOLONE 4 MG PO TBPK: ORAL_TABLET | ORAL | 0 refills | Status: DC

## 2017-04-12 MED ORDER — IBUPROFEN 600 MG PO TABS: 600.0000 mg | ORAL_TABLET | Freq: Three times a day (TID) | ORAL | 0 refills | Status: DC | PRN

## 2017-04-12 MED ORDER — CYCLOBENZAPRINE HCL 5 MG PO TABS: 5.0000 mg | ORAL_TABLET | Freq: Every evening | ORAL | 0 refills | Status: DC | PRN

## 2017-04-12 NOTE — ED Provider Notes (Signed)
[facility] EMERGENCY DEPARTMENT Provider Note   CSN: [id number] Arrival date & time: 04/12/17  1121     History   Chief Complaint Chief Complaint  Patient presents with  . Back Pain    HPI Patrick Thompson is a 66 y.o. male history of hypertension who presents to the emergency department today for low back pain times 1 week.  Patient states that one week ago he developed right lower back pain that radiates into the posterior aspect of his hamstring, posterior calf and lateral aspect of his ankle/foot.  There is associated tingling in this distribution.  No numbness.  He notes that the pain is worse with ambulation and also when extending his back after bending down to pick something up.  He has been taking Aleve for this with mild/moderate relief.  Currently rates his pain as 8/10 and describes as a aching sensation. He denies history of cancer, trauma, fever, night pain, IV drug use, recent spinal manipulation or procedures, upper back pain or neck pain, weakness of the lower extremities, urinary retention, loss of bowel/bladder function, saddle anesthesia, unexplained weight loss, dysuria, flank pain, suprapubic pain, urinary frequency, urinary urgency, or hematuria. LBM today and normal.   HPI  Past Medical History:  Diagnosis Date  . Hypertension    No longer on medication    Patient Active Problem List   Diagnosis Date Noted  . Syncope 10/20/2015    History reviewed. No pertinent surgical history.     Home Medications    Prior to Admission medications   Medication Sig Start Date End Date Taking? Authorizing Provider  diclofenac sodium (VOLTAREN) 1 % GEL Apply 2 g topically 4 (four) times daily. 10/20/15   Norman Herrlich, MD    Family History Family History  Problem Relation Age of Onset  . Hypertension Mother   . Cancer Father        Prostate   . Heart disease Brother        52 brother, MI in late 30s    Social History Social History    Tobacco Use  . Smoking status: Current Some Day Smoker    Packs/day: 0.10    Years: 40.00    Pack years: 4.00    Types: Cigarettes  . Smokeless tobacco: Never Used  Substance Use Topics  . Alcohol use: Yes    Alcohol/week: 0.0 oz    Comment: occasionally  . Drug use: No     Allergies   Patient has no known allergies.   Review of Systems Review of Systems  All other systems reviewed and are negative.    Physical Exam Updated Vital Signs BP (!) 145/95 (BP Location: Right Arm)   Pulse 100   Temp 97.8 F (36.6 C)   Resp 16   Ht 5\' 10"  (1.778 m)   Wt 81.6 kg (180 lb)   SpO2 98%   BMI 25.83 kg/m   Physical Exam  Constitutional: He appears well-developed and well-nourished. No distress.  Non-toxic appearing  HENT:  Head: Normocephalic and atraumatic.  Right Ear: External ear normal.  Left Ear: External ear normal.  Neck: Normal range of motion. Neck supple. No spinous process tenderness present. No neck rigidity. Normal range of motion present.  Cardiovascular: Normal rate, regular rhythm, normal heart sounds and intact distal pulses.  No murmur heard. Pulses:      Radial pulses are 2+ on the right side, and 2+ on the left side.  Femoral pulses are 2+ on the right side, and 2+ on the left side.      Dorsalis pedis pulses are 2+ on the right side, and 2+ on the left side.       Posterior tibial pulses are 2+ on the right side, and 2+ on the left side.  Pulmonary/Chest: Effort normal and breath sounds normal. No respiratory distress.  Abdominal: Soft. Bowel sounds are normal. He exhibits no pulsatile midline mass. There is no tenderness. There is no rigidity, no rebound and no CVA tenderness.  Musculoskeletal:       Right hip: He exhibits normal range of motion, normal strength and no tenderness.       Back:  Posterior and appearance appears normal. No evidence of obvious scoliosis or kyphosis. No obvious signs of skin changes, trauma, deformity, infection.  No C, T, or L spine tenderness or step-offs to palpation. No C, T paraspinal tenderness. Right sided paraspinal ttp extending into superior gluteus (see diagram). Normal flexion of the spine. Notes pain with extension of this spine.  Lung expansion normal. Bilateral lower extremity strength 5 out of 5. Patellar and Achilles deep tendon reflex 2+ and equal bilaterally. Sensation of lower extremities intact to light touch. Straight leg right positive. Straight leg left positive. Gait able but patient notes painful. Lower extremity compartments soft. PT and DP 2+ b/l. Cap refill <2 seconds.   Neurological: He is alert. He has normal strength. No sensory deficit.  Speech clear. Follows commands. No facial droop. PERRLA. EOM grossly intact. CN III-XII grossly intact. Grossly moves all extremities 4 without ataxia. Able and appropriate strength for age to upper and lower extremities bilaterally including grip strength, knee extension, ankle plantar flexion and dorsiflexion. Gait able but notes it is painful.   Skin: Skin is warm, dry and intact. Capillary refill takes less than 2 seconds. No rash noted. He is not diaphoretic. No erythema.  Nursing note and vitals reviewed.    ED Treatments / Results  Labs (all labs ordered are listed, but only abnormal results are displayed) Labs Reviewed - No data to display  EKG  EKG Interpretation None       Radiology No results found.  Procedures Procedures (including critical care time)  Medications Ordered in ED Medications - No data to display   Initial Impression / Assessment and Plan / ED Course  I have reviewed the triage vital signs and the nursing notes.  Pertinent labs & imaging results that were available during my care of the patient were reviewed by me and considered in my medical decision making (see chart for details).     66 year old male with one-week history of right low back pain with radiation into right leg with associated  tingling.  On exam patient is with right lumbar paraspinal tenderness palpation as well as superior gluteus tenderness palpation.  He has a positive straight leg test on the right.  Neurologic exam otherwise unremarkable.  Patient can walk but states it is painful.  There is no evidence of hip injury and he does denies any falls.  He is with intact strength, sensation of the lower extremities.  No loss of bowel or bladder control.  Still having normal urine output and bowel movements.  No concern for cauda equina syndrome.  No abdominal tenderness to palpation and no pulsatile mass of the abdomen.  Do not suspect AAA.  No fever, night sweats, weight loss or history of cancer.  No IV drug use or recent  back injections.  Patient is afebrile on presentation.  Suspect that patient's back pain is due to MSK with Sciatica.  Will prescribe back exercises, advised patient to not push/pull anything greater than 10 pounds for the next 1 week, give muscle relaxers, NSAIDs, and Medrol Dosepak.  He is without history of diabetes, GI bleed or renal disease that would prevent him from receiving a these medications.  Patient is to follow-up with PCP vs Ortho in 1 week for continued symptoms.  Strict return precautions discussed.  Patient appears safe for discharge.   Final Clinical Impressions(s) / ED Diagnoses   Final diagnoses:  Acute right-sided low back pain with right-sided sciatica    ED Discharge Orders        Ordered    methylPREDNISolone (MEDROL DOSEPAK) 4 MG TBPK tablet     04/12/17 1242    cyclobenzaprine (FLEXERIL) 5 MG tablet  At bedtime PRN     04/12/17 1242    ibuprofen (ADVIL,MOTRIN) 600 MG tablet  Every 8 hours PRN     04/12/17 1242       Lorelle Gibbs 04/12/17 1242    Carmin Muskrat, MD 04/14/17 [id number]

## 2017-04-12 NOTE — ED Triage Notes (Signed)
Per Pt, Pt is coming from home with lower back pain that is radiating to his right leg. Pt reports some intermittent tingling to the right foot and right knee pain. Denies injury.

## 2017-04-12 NOTE — ED Notes (Signed)
Patient is in the room waiting for provider

## 2017-04-12 NOTE — Discharge Instructions (Signed)
You were seen here today for Back Pain: Low back pain is discomfort in the lower back that may be due to injuries to muscles and ligaments around the spine. Occasionally, it may be caused by a problem to a part of the spine called a disc. Your back pain should be treated with medicines listed below as well as back exercises and this back pain should get better over the next 2 weeks. Most patients get completely well in 4 weeks. It is important to know however, if you develop severe or worsening pain, low back pain with fever, numbness, weakness or inability to walk or urinate, you should return to the ER immediately.  Please follow up with your doctor this week for a recheck if still having symptoms.  HOME INSTRUCTIONS Self - care:  The application of heat can help soothe the pain.  Maintaining your daily activities, including walking (this is encouraged), as it will help you get better faster than just staying in bed. Do not life, push, pull anything more than 10 pounds for the next week. I am attaching back exercises that you can do at home to help facilitate your recovery.   Back Exercises - I have attached a handout on back exercises that can be done at home to help facilitate your recovery.   Medications are also useful to help with pain control.   Acetaminophen.  This medication is generally safe, and found over the counter. Take as directed for your age. You should not take more than 8 of the extra strength (500mg ) pills a day (max dose is 4000mg  total OVER one day)  Non steroidal anti inflammatory: This includes medications including Ibuprofen, naproxen and Mobic; These medications help both pain and swelling and are very useful in treating back pain.  They should be taken with food, as they can cause stomach upset, and more seriously, stomach bleeding. Do not combine the medications.   Muscle relaxants:  These medications can help with muscle tightness that is a cause of lower back pain.  Most  of these medications can cause drowsiness, and it is not safe to drive or use dangerous machinery while taking them. They are primarily helpful when taken at night before sleep.  Medrol Dose Pack - A Medrol Dose Pack can be used for low back pain when nerves such as the sciatic nerve are though to be involved. This medication will help decrease the inflammation around the nerve and help facilitate faster recovery. This type of medicine is "tapered" which means you take a little less as time goes on. Follow instructions on how to take this medication. Call your pharmacist if you have any questions.  You will need to follow up with your primary healthcare provider or the Orthopedist in 1-2 weeks for reassessment and persistent symptoms.  Be aware that if you develop new symptoms, such as a fever, leg weakness, difficulty with or loss of control of your urine or bowels, abdominal pain, or more severe pain, you will need to seek medical attention and/or return to the Emergency department. Additional Information:  Your vital signs today were: BP 130/90 (BP Location: Left Arm)    Pulse 98    Temp 97.8 F (36.6 C)    Resp 16    Ht 5\' 10"  (1.778 m)    Wt 81.6 kg (180 lb)    SpO2 98%    BMI 25.83 kg/m  If your blood pressure (BP) was elevated above 135/85 this visit, please have this  repeated by your doctor within one month. ---------------

## 2017-04-23 ENCOUNTER — Emergency Department (HOSPITAL_COMMUNITY): Payer: Medicare Other | Admitting: Anesthesiology

## 2017-04-23 ENCOUNTER — Encounter (HOSPITAL_COMMUNITY): Payer: Self-pay

## 2017-04-23 ENCOUNTER — Emergency Department (HOSPITAL_COMMUNITY): Payer: Medicare Other

## 2017-04-23 ENCOUNTER — Observation Stay (HOSPITAL_COMMUNITY)
Admission: EM | Admit: 2017-04-23 | Discharge: 2017-04-24 | Disposition: A | Discharge status: Home / Self Care | Payer: Medicare Other | Attending: Neurosurgery | Admitting: Neurosurgery

## 2017-04-23 ENCOUNTER — Encounter (HOSPITAL_COMMUNITY)
Admission: EM | Disposition: A | Discharge status: Home / Self Care | Payer: Self-pay | Source: Home / Self Care | Attending: Emergency Medicine

## 2017-04-23 ENCOUNTER — Other Ambulatory Visit: Payer: Self-pay

## 2017-04-23 DIAGNOSIS — Z419 Encounter for procedure for purposes other than remedying health state, unspecified: Secondary | ICD-10-CM

## 2017-04-23 DIAGNOSIS — M5106 Intervertebral disc disorders with myelopathy, lumbar region: Secondary | ICD-10-CM | POA: Diagnosis not present

## 2017-04-23 DIAGNOSIS — G834 Cauda equina syndrome: Secondary | ICD-10-CM | POA: Diagnosis not present

## 2017-04-23 DIAGNOSIS — F17210 Nicotine dependence, cigarettes, uncomplicated: Secondary | ICD-10-CM | POA: Insufficient documentation

## 2017-04-23 DIAGNOSIS — I10 Essential (primary) hypertension: Secondary | ICD-10-CM | POA: Insufficient documentation

## 2017-04-23 DIAGNOSIS — M5126 Other intervertebral disc displacement, lumbar region: Secondary | ICD-10-CM

## 2017-04-23 HISTORY — PX: LUMBAR LAMINECTOMY/DECOMPRESSION MICRODISCECTOMY: SHX5026

## 2017-04-23 HISTORY — DX: Other intervertebral disc displacement, lumbar region: M51.26

## 2017-04-23 LAB — BASIC METABOLIC PANEL
Anion gap: 9 (ref 5–15)
BUN: 21 mg/dL — ABNORMAL HIGH (ref 6–20)
CO2: 24 mmol/L (ref 22–32)
Calcium: 9.2 mg/dL (ref 8.9–10.3)
Chloride: 105 mmol/L (ref 101–111)
Creatinine, Ser: 1.32 mg/dL — ABNORMAL HIGH (ref 0.61–1.24)
GFR calc Af Amer: >60 mL/min (ref >60)
GFR calc non Af Amer: 55 mL/min — ABNORMAL LOW (ref >60)
Glucose, Bld: 106 mg/dL — ABNORMAL HIGH (ref 65–99)
Potassium: 3.6 mmol/L (ref 3.5–5.1)
Sodium: 138 mmol/L (ref 135–145)

## 2017-04-23 LAB — CBC WITH DIFFERENTIAL/PLATELET
Basophils Absolute: 0 10*3/uL (ref 0.0–0.1)
Basophils Relative: 0 %
Eosinophils Absolute: 0.1 10*3/uL (ref 0.0–0.7)
Eosinophils Relative: 2 %
HCT: 42.8 % (ref 39.0–52.0)
Hemoglobin: 14.7 g/dL (ref 13.0–17.0)
Lymphocytes Relative: 23 %
Lymphs Abs: 1.7 10*3/uL (ref 0.7–4.0)
MCH: 29.3 pg (ref 26.0–34.0)
MCHC: 34.3 g/dL (ref 30.0–36.0)
MCV: 85.3 fL (ref 78.0–100.0)
Monocytes Absolute: 0.5 10*3/uL (ref 0.1–1.0)
Monocytes Relative: 6 %
Neutro Abs: 5.2 10*3/uL (ref 1.7–7.7)
Neutrophils Relative %: 69 %
Platelets: 299 10*3/uL (ref 150–400)
RBC: 5.02 MIL/uL (ref 4.22–5.81)
RDW: 13.5 % (ref 11.5–15.5)
WBC: 7.4 10*3/uL (ref 4.0–10.5)

## 2017-04-23 LAB — URINALYSIS, ROUTINE W REFLEX MICROSCOPIC
Bilirubin Urine: NEGATIVE
Glucose, UA: NEGATIVE mg/dL
Hgb urine dipstick: NEGATIVE
Ketones, ur: NEGATIVE mg/dL
Leukocytes, UA: NEGATIVE
Nitrite: NEGATIVE
Protein, ur: NEGATIVE mg/dL
Specific Gravity, Urine: 1.021 (ref 1.005–1.030)
pH: 5 (ref 5.0–8.0)

## 2017-04-23 SURGERY — LUMBAR LAMINECTOMY/DECOMPRESSION MICRODISCECTOMY 1 LEVEL
Anesthesia: General | Site: Back | Laterality: Bilateral

## 2017-04-23 MED ORDER — NAPROXEN 250 MG PO TABS
250.0000 mg | ORAL_TABLET | Freq: Two times a day (BID) | ORAL | Status: DC
  Administered 2017-04-24 (×2): 250 mg via ORAL
  Filled 2017-04-23 (×2): qty 1

## 2017-04-23 MED ORDER — ACETAMINOPHEN 10 MG/ML IV SOLN
INTRAVENOUS | Status: AC
Start: 2017-04-23 — End: 2017-04-23
  Filled 2017-04-23: qty 100

## 2017-04-23 MED ORDER — LIDOCAINE 2% (20 MG/ML) 5 ML SYRINGE
INTRAMUSCULAR | Status: AC
  Filled 2017-04-23: qty 5

## 2017-04-23 MED ORDER — CYCLOBENZAPRINE HCL 5 MG PO TABS
5.0000 mg | ORAL_TABLET | Freq: Every evening | ORAL | Status: DC | PRN
  Administered 2017-04-24: 5 mg via ORAL
  Filled 2017-04-23: qty 1

## 2017-04-23 MED ORDER — ACETAMINOPHEN 10 MG/ML IV SOLN
INTRAVENOUS | Status: DC | PRN
  Administered 2017-04-23: 1000 mg via INTRAVENOUS

## 2017-04-23 MED ORDER — SUCCINYLCHOLINE CHLORIDE 200 MG/10ML IV SOSY
PREFILLED_SYRINGE | INTRAVENOUS | Status: AC
  Filled 2017-04-23: qty 10

## 2017-04-23 MED ORDER — SODIUM CHLORIDE 0.9% FLUSH: 3.0000 mL | INTRAVENOUS | Status: DC | PRN

## 2017-04-23 MED ORDER — ONDANSETRON HCL 4 MG PO TABS: 4.0000 mg | ORAL_TABLET | Freq: Four times a day (QID) | ORAL | Status: DC | PRN

## 2017-04-23 MED ORDER — LACTATED RINGERS IV SOLN
INTRAVENOUS | Status: DC | PRN
  Administered 2017-04-23: 18:00:00 via INTRAVENOUS

## 2017-04-23 MED ORDER — PHENOL 1.4 % MT LIQD
1.0000 | OROMUCOSAL | Status: DC | PRN
Start: 2017-04-23 — End: 2017-04-25

## 2017-04-23 MED ORDER — DEXAMETHASONE SODIUM PHOSPHATE 10 MG/ML IJ SOLN
INTRAMUSCULAR | Status: DC | PRN
  Administered 2017-04-23: 10 mg via INTRAVENOUS

## 2017-04-23 MED ORDER — ONDANSETRON HCL 4 MG/2ML IJ SOLN: 4.0000 mg | Freq: Four times a day (QID) | INTRAMUSCULAR | Status: DC | PRN

## 2017-04-23 MED ORDER — ROCURONIUM BROMIDE 10 MG/ML (PF) SYRINGE
PREFILLED_SYRINGE | INTRAVENOUS | Status: AC
  Filled 2017-04-23: qty 5

## 2017-04-23 MED ORDER — SODIUM CHLORIDE 0.9 % IV SOLN
250.0000 mL | INTRAVENOUS | Status: DC
  Administered 2017-04-23: 250 mL via INTRAVENOUS

## 2017-04-23 MED ORDER — MIDAZOLAM HCL 5 MG/5ML IJ SOLN
INTRAMUSCULAR | Status: DC | PRN
  Administered 2017-04-23: 2 mg via INTRAVENOUS

## 2017-04-23 MED ORDER — MIDAZOLAM HCL 2 MG/2ML IJ SOLN
INTRAMUSCULAR | Status: AC
  Filled 2017-04-23: qty 2

## 2017-04-23 MED ORDER — CEFAZOLIN SODIUM-DEXTROSE 2-4 GM/100ML-% IV SOLN
INTRAVENOUS | Status: AC
  Filled 2017-04-23: qty 100

## 2017-04-23 MED ORDER — LIDOCAINE HCL (CARDIAC) 20 MG/ML IV SOLN
INTRAVENOUS | Status: DC | PRN
  Administered 2017-04-23: 60 mg via INTRAVENOUS

## 2017-04-23 MED ORDER — BACITRACIN 50000 UNITS IM SOLR
INTRAMUSCULAR | Status: DC | PRN
  Administered 2017-04-23: 19:00:00

## 2017-04-23 MED ORDER — HYDROMORPHONE HCL 1 MG/ML IJ SOLN: 0.2500 mg | INTRAMUSCULAR | Status: DC | PRN

## 2017-04-23 MED ORDER — MEPERIDINE HCL 25 MG/ML IJ SOLN: 6.2500 mg | INTRAMUSCULAR | Status: DC | PRN

## 2017-04-23 MED ORDER — 0.9 % SODIUM CHLORIDE (POUR BTL) OPTIME
TOPICAL | Status: DC | PRN
  Administered 2017-04-23: 1000 mL

## 2017-04-23 MED ORDER — CEFAZOLIN SODIUM-DEXTROSE 2-4 GM/100ML-% IV SOLN
2.0000 g | Freq: Once | INTRAVENOUS | Status: AC
  Administered 2017-04-23: 2 g via INTRAVENOUS

## 2017-04-23 MED ORDER — NAPROXEN SODIUM 220 MG PO TABS: 220.0000 mg | ORAL_TABLET | Freq: Two times a day (BID) | ORAL | Status: DC

## 2017-04-23 MED ORDER — FENTANYL CITRATE (PF) 100 MCG/2ML IJ SOLN
INTRAMUSCULAR | Status: DC | PRN
  Administered 2017-04-23: 50 ug via INTRAVENOUS
  Administered 2017-04-23: 100 ug via INTRAVENOUS
  Administered 2017-04-23 (×2): 50 ug via INTRAVENOUS
  Administered 2017-04-23: 100 ug via INTRAVENOUS

## 2017-04-23 MED ORDER — HEMOSTATIC AGENTS (NO CHARGE) OPTIME
TOPICAL | Status: DC | PRN
  Administered 2017-04-23: 1 via TOPICAL

## 2017-04-23 MED ORDER — MIDAZOLAM HCL 2 MG/2ML IJ SOLN: 0.5000 mg | Freq: Once | INTRAMUSCULAR | Status: DC | PRN

## 2017-04-23 MED ORDER — ACETAMINOPHEN 650 MG RE SUPP: 650.0000 mg | RECTAL | Status: DC | PRN

## 2017-04-23 MED ORDER — ACETAMINOPHEN 325 MG PO TABS: 650.0000 mg | ORAL_TABLET | ORAL | Status: DC | PRN

## 2017-04-23 MED ORDER — MENTHOL 3 MG MT LOZG: 1.0000 | LOZENGE | OROMUCOSAL | Status: DC | PRN

## 2017-04-23 MED ORDER — ONDANSETRON HCL 4 MG/2ML IJ SOLN
INTRAMUSCULAR | Status: DC | PRN
  Administered 2017-04-23: 4 mg via INTRAVENOUS

## 2017-04-23 MED ORDER — ALUM & MAG HYDROXIDE-SIMETH 200-200-20 MG/5ML PO SUSP: 30.0000 mL | Freq: Four times a day (QID) | ORAL | Status: DC | PRN

## 2017-04-23 MED ORDER — PROMETHAZINE HCL 25 MG/ML IJ SOLN: 6.2500 mg | INTRAMUSCULAR | Status: DC | PRN

## 2017-04-23 MED ORDER — SODIUM CHLORIDE 0.9% FLUSH
3.0000 mL | Freq: Two times a day (BID) | INTRAVENOUS | Status: DC
  Administered 2017-04-24: 3 mL via INTRAVENOUS

## 2017-04-23 MED ORDER — SUGAMMADEX SODIUM 200 MG/2ML IV SOLN
INTRAVENOUS | Status: DC | PRN
  Administered 2017-04-23: 200 mg via INTRAVENOUS

## 2017-04-23 MED ORDER — THROMBIN (RECOMBINANT) 5000 UNITS EX SOLR
CUTANEOUS | Status: DC | PRN
  Administered 2017-04-23 (×2): 5000 [IU] via TOPICAL

## 2017-04-23 MED ORDER — OXYCODONE HCL 5 MG PO TABS
10.0000 mg | ORAL_TABLET | ORAL | Status: DC | PRN
  Administered 2017-04-24 (×2): 10 mg via ORAL
  Filled 2017-04-23 (×2): qty 2

## 2017-04-23 MED ORDER — CYCLOBENZAPRINE HCL 10 MG PO TABS
10.0000 mg | ORAL_TABLET | Freq: Three times a day (TID) | ORAL | Status: DC | PRN
  Administered 2017-04-24: 10 mg via ORAL
  Filled 2017-04-23: qty 1

## 2017-04-23 MED ORDER — PANTOPRAZOLE SODIUM 40 MG IV SOLR
40.0000 mg | Freq: Every day | INTRAVENOUS | Status: DC
  Administered 2017-04-23: 40 mg via INTRAVENOUS
  Filled 2017-04-23: qty 40

## 2017-04-23 MED ORDER — ONDANSETRON HCL 4 MG/2ML IJ SOLN
INTRAMUSCULAR | Status: AC
  Filled 2017-04-23: qty 2

## 2017-04-23 MED ORDER — LIDOCAINE-EPINEPHRINE 1 %-1:100000 IJ SOLN
INTRAMUSCULAR | Status: DC | PRN
  Administered 2017-04-23: 9 mL

## 2017-04-23 MED ORDER — PROPOFOL 10 MG/ML IV BOLUS
INTRAVENOUS | Status: AC
  Filled 2017-04-23: qty 20

## 2017-04-23 MED ORDER — CEFAZOLIN SODIUM-DEXTROSE 2-4 GM/100ML-% IV SOLN
2.0000 g | Freq: Three times a day (TID) | INTRAVENOUS | Status: AC
  Administered 2017-04-24 (×2): 2 g via INTRAVENOUS
  Filled 2017-04-23 (×2): qty 100

## 2017-04-23 MED ORDER — FENTANYL CITRATE (PF) 250 MCG/5ML IJ SOLN
INTRAMUSCULAR | Status: AC
  Filled 2017-04-23: qty 5

## 2017-04-23 MED ORDER — LIDOCAINE-EPINEPHRINE 1 %-1:100000 IJ SOLN
INTRAMUSCULAR | Status: AC
  Filled 2017-04-23: qty 1

## 2017-04-23 MED ORDER — HYDROMORPHONE HCL 1 MG/ML IJ SOLN: 0.5000 mg | INTRAMUSCULAR | Status: DC | PRN

## 2017-04-23 MED ORDER — BUPIVACAINE HCL (PF) 0.25 % IJ SOLN
INTRAMUSCULAR | Status: AC
  Filled 2017-04-23: qty 30

## 2017-04-23 MED ORDER — BUPIVACAINE HCL (PF) 0.25 % IJ SOLN
INTRAMUSCULAR | Status: DC | PRN
  Administered 2017-04-23: 10 mL

## 2017-04-23 MED ORDER — ROCURONIUM BROMIDE 100 MG/10ML IV SOLN
INTRAVENOUS | Status: DC | PRN
Start: 2017-04-23 — End: 2017-04-23
  Administered 2017-04-23: 10 mg via INTRAVENOUS
  Administered 2017-04-23: 50 mg via INTRAVENOUS

## 2017-04-23 MED ORDER — THROMBIN (RECOMBINANT) 5000 UNITS EX SOLR
CUTANEOUS | Status: AC
  Filled 2017-04-23: qty 10000

## 2017-04-23 MED ORDER — DEXAMETHASONE SODIUM PHOSPHATE 10 MG/ML IJ SOLN
INTRAMUSCULAR | Status: AC
  Filled 2017-04-23: qty 1

## 2017-04-23 MED ORDER — PROPOFOL 10 MG/ML IV BOLUS
INTRAVENOUS | Status: DC | PRN
  Administered 2017-04-23: 200 mg via INTRAVENOUS

## 2017-04-23 SURGICAL SUPPLY — 54 items
BAG DECANTER FOR FLEXI CONT (MISCELLANEOUS) ×2 IMPLANT
BENZOIN TINCTURE PRP APPL 2/3 (GAUZE/BANDAGES/DRESSINGS) ×2 IMPLANT
BLADE CLIPPER SURG (BLADE) IMPLANT
BLADE SURG 11 STRL SS (BLADE) ×2 IMPLANT
BUR CUTTER 7.0 ROUND (BURR) ×2 IMPLANT
BUR MATCHSTICK NEURO 3.0 LAGG (BURR) ×2 IMPLANT
CANISTER SUCT 3000ML PPV (MISCELLANEOUS) ×2 IMPLANT
CARTRIDGE OIL MAESTRO DRILL (MISCELLANEOUS) ×1 IMPLANT
CLSR STERI-STRIP ANTIMIC 1/2X4 (GAUZE/BANDAGES/DRESSINGS) ×2 IMPLANT
DECANTER SPIKE VIAL GLASS SM (MISCELLANEOUS) ×2 IMPLANT
DERMABOND ADVANCED (GAUZE/BANDAGES/DRESSINGS) ×1
DERMABOND ADVANCED .7 DNX12 (GAUZE/BANDAGES/DRESSINGS) ×1 IMPLANT
DIFFUSER DRILL AIR PNEUMATIC (MISCELLANEOUS) ×2 IMPLANT
DRAPE HALF SHEET 40X57 (DRAPES) IMPLANT
DRAPE LAPAROTOMY 100X72X124 (DRAPES) ×2 IMPLANT
DRAPE MICROSCOPE LEICA (MISCELLANEOUS) ×2 IMPLANT
DRAPE POUCH INSTRU U-SHP 10X18 (DRAPES) ×2 IMPLANT
DRAPE SURG 17X23 STRL (DRAPES) ×2 IMPLANT
DRSG OPSITE POSTOP 4X6 (GAUZE/BANDAGES/DRESSINGS) ×2 IMPLANT
DURAPREP 26ML APPLICATOR (WOUND CARE) ×2 IMPLANT
ELECT REM PT RETURN 9FT ADLT (ELECTROSURGICAL) ×2
ELECTRODE REM PT RTRN 9FT ADLT (ELECTROSURGICAL) ×1 IMPLANT
GAUZE SPONGE 4X4 12PLY STRL (GAUZE/BANDAGES/DRESSINGS) ×2 IMPLANT
GAUZE SPONGE 4X4 16PLY XRAY LF (GAUZE/BANDAGES/DRESSINGS) IMPLANT
GLOVE BIO SURGEON STRL SZ7 (GLOVE) IMPLANT
GLOVE BIO SURGEON STRL SZ8 (GLOVE) ×2 IMPLANT
GLOVE BIOGEL PI IND STRL 7.0 (GLOVE) IMPLANT
GLOVE BIOGEL PI INDICATOR 7.0 (GLOVE)
GLOVE EXAM NITRILE LRG STRL (GLOVE) IMPLANT
GLOVE EXAM NITRILE XL STR (GLOVE) IMPLANT
GLOVE EXAM NITRILE XS STR PU (GLOVE) IMPLANT
GLOVE INDICATOR 8.5 STRL (GLOVE) ×2 IMPLANT
GOWN STRL REUS W/ TWL LRG LVL3 (GOWN DISPOSABLE) ×1 IMPLANT
GOWN STRL REUS W/ TWL XL LVL3 (GOWN DISPOSABLE) ×2 IMPLANT
GOWN STRL REUS W/TWL 2XL LVL3 (GOWN DISPOSABLE) IMPLANT
GOWN STRL REUS W/TWL LRG LVL3 (GOWN DISPOSABLE) ×1
GOWN STRL REUS W/TWL XL LVL3 (GOWN DISPOSABLE) ×2
KIT BASIN OR (CUSTOM PROCEDURE TRAY) ×2 IMPLANT
KIT ROOM TURNOVER OR (KITS) ×2 IMPLANT
NEEDLE HYPO 22GX1.5 SAFETY (NEEDLE) ×2 IMPLANT
NEEDLE SPNL 22GX3.5 QUINCKE BK (NEEDLE) ×2 IMPLANT
NS IRRIG 1000ML POUR BTL (IV SOLUTION) ×2 IMPLANT
OIL CARTRIDGE MAESTRO DRILL (MISCELLANEOUS) ×2
PACK LAMINECTOMY NEURO (CUSTOM PROCEDURE TRAY) ×2 IMPLANT
RUBBERBAND STERILE (MISCELLANEOUS) ×4 IMPLANT
SPONGE SURGIFOAM ABS GEL SZ50 (HEMOSTASIS) ×2 IMPLANT
STRIP CLOSURE SKIN 1/2X4 (GAUZE/BANDAGES/DRESSINGS) ×2 IMPLANT
SUT VIC AB 0 CT1 18XCR BRD8 (SUTURE) ×1 IMPLANT
SUT VIC AB 0 CT1 8-18 (SUTURE) ×1
SUT VIC AB 2-0 CT1 18 (SUTURE) ×2 IMPLANT
SUT VICRYL 4-0 PS2 18IN ABS (SUTURE) ×2 IMPLANT
TOWEL GREEN STERILE (TOWEL DISPOSABLE) ×2 IMPLANT
TOWEL GREEN STERILE FF (TOWEL DISPOSABLE) ×2 IMPLANT
WATER STERILE IRR 1000ML POUR (IV SOLUTION) ×2 IMPLANT

## 2017-04-23 NOTE — ED Provider Notes (Signed)
[facility] EMERGENCY DEPARTMENT Provider Note   CSN: [id number] Arrival date & time: 04/23/17  7829     History   Chief Complaint No chief complaint on file.   HPI Kay Ricciuti is a 66 y.o. male.  HPI Patient presents to the emergency department with lower back discomfort that is been increasing over the last 2 weeks.  Patient states he is also having difficulty with ambulation.  He states he is having difficulty standing due to lower back discomfort.  The patient states that he is also now having problems where when he stands up he does urinate uncontrollably.  The patient states that that is the major change over the last week.  Patient states that the ambulation is also been an issue.  The patient denies chest pain, shortness of breath, headache,blurred vision, neck pain, fever, cough, weakness, numbness, dizziness, anorexia, edema, abdominal pain, nausea, vomiting, diarrhea, rash, back pain, dysuria, hematemesis, bloody stool, near syncope, or syncope. Past Medical History:  Diagnosis Date  . Hypertension    No longer on medication    Patient Active Problem List   Diagnosis Date Noted  . Syncope 10/20/2015    History reviewed. No pertinent surgical history.     Home Medications    Prior to Admission medications   Medication Sig Start Date End Date Taking? Authorizing Provider  cyclobenzaprine (FLEXERIL) 5 MG tablet Take 1 tablet (5 mg total) by mouth at bedtime as needed for muscle spasms. 04/12/17  Yes Maczis, Barth Kirks, PA-C  ibuprofen (ADVIL,MOTRIN) 600 MG tablet Take 1 tablet (600 mg total) by mouth every 8 (eight) hours as needed. 04/12/17  Yes Maczis, Barth Kirks, PA-C  methylPREDNISolone (MEDROL DOSEPAK) 4 MG TBPK tablet Day 1: 8 mg PO before breakfast, 4 mg after lunch and after dinner, and 8 mg at bedtime  Day 2: 4 mg PO before breakfast, after lunch, and after dinner and 8 mg at bedtime  Day 3: 4 mg PO before breakfast, after lunch, after  dinner, and at bedtime  Day 4: 4 mg PO before breakfast, after lunch, and at bedtime  Day 5: 4 mg PO before breakfast and at bedtime  Day 6: 4 mg PO before breakfast 04/12/17  Yes Maczis, Barth Kirks, PA-C  naproxen sodium (ALEVE) 220 MG tablet Take 220 mg by mouth every 12 (twelve) hours.   Yes [provider]  diclofenac sodium (VOLTAREN) 1 % GEL Apply 2 g topically 4 (four) times daily. Patient not taking: Reported on 04/23/2017 10/20/15   Norman Herrlich, MD    Family History Family History  Problem Relation Age of Onset  . Hypertension Mother   . Cancer Father        Prostate   . Heart disease Brother        39 brother, MI in late 73s    Social History Social History   Tobacco Use  . Smoking status: Current Some Day Smoker    Packs/day: 0.10    Years: 40.00    Pack years: 4.00    Types: Cigarettes  . Smokeless tobacco: Never Used  Substance Use Topics  . Alcohol use: Yes    Alcohol/week: 0.0 oz    Comment: occasionally  . Drug use: No     Allergies   Patient has no known allergies.   Review of Systems Review of Systems All other systems negative except as documented in the HPI. All pertinent positives and negatives as reviewed in the HPI.  Physical  Exam Updated Vital Signs BP (!) 126/100 (BP Location: Left Arm)   Pulse 71   Temp 98.2 F (36.8 C) (Oral)   Resp 17   Ht 5\' 10"  (1.778 m)   Wt 81.6 kg (180 lb)   SpO2 99%   BMI 25.83 kg/m   Physical Exam  Constitutional: He is oriented to person, place, and time. He appears well-developed and well-nourished. No distress.  HENT:  Head: Normocephalic and atraumatic.  Mouth/Throat: Oropharynx is clear and moist.  Eyes: Pupils are equal, round, and reactive to light.  Neck: Normal range of motion. Neck supple.  Cardiovascular: Normal rate, regular rhythm and normal heart sounds. Exam reveals no gallop and no friction rub.  No murmur heard. Pulmonary/Chest: Effort normal and breath sounds normal.  No respiratory distress. He has no wheezes.  Abdominal: Soft. Bowel sounds are normal. He exhibits no distension. There is no tenderness.  Neurological: He is alert and oriented to person, place, and time. No sensory deficit. He exhibits normal muscle tone. Gait abnormal. Coordination normal.  Patient does have some mild decreased strength on the right as compared to the left  Skin: Skin is warm and dry. Capillary refill takes less than 2 seconds. No rash noted. No erythema.  Psychiatric: He has a normal mood and affect. His behavior is normal.  Nursing note and vitals reviewed.    ED Treatments / Results  Labs (all labs ordered are listed, but only abnormal results are displayed) Labs Reviewed  BASIC METABOLIC PANEL - Abnormal; Notable for the following components:      Result Value   Glucose, Bld 106 (*)    BUN 21 (*)    Creatinine, Ser 1.32 (*)    GFR calc non Af Amer 55 (*)    All other components within normal limits  CBC WITH DIFFERENTIAL/PLATELET  URINALYSIS, ROUTINE W REFLEX MICROSCOPIC    EKG  EKG Interpretation None       Radiology Mr Lumbar Spine Wo Contrast  Result Date: 04/23/2017 CLINICAL DATA:  Back pain. Right leg tingling. Urinary incontinence. EXAM: MRI LUMBAR SPINE WITHOUT CONTRAST TECHNIQUE: Multiplanar, multisequence MR imaging of the lumbar spine was performed. No intravenous contrast was administered. COMPARISON:  None. FINDINGS: Segmentation:  Standard. Alignment:  Trace retrolisthesis of L4 on L5. Vertebrae: No fracture or suspicious osseous lesion. Multilevel type 2 degenerative endplate changes, greatest at L5-S1 where there is also mild type 1 change as well. Conus medullaris and cauda equina: Conus extends to the L1 level. Conus and cauda equina appear normal. Paraspinal and other soft tissues: Prominent bladder distention. Disc levels: Disc desiccation throughout the lumbar spine, greatest at L2-3 and L5-S1. Mild disc space narrowing at L4-5 with  severe narrowing at L5-S1. T12-L1: Negative. L1-2: Minimal disc bulging, tiny central/ left central disc extrusion, and mild facet hypertrophy without stenosis. L2-3: Mild disc bulging and mild facet hypertrophy without stenosis. L3-4: Disc bulging, a right foraminal to extraforaminal disc protrusion, and mild facet hypertrophy result in mild right neural foraminal stenosis with mild deflection of the exiting right L3 nerve. No spinal stenosis. L4-5: There is a massive left paracentral disc extrusion with superior migration to the L4 superior endplate level. This completely obliterates the spinal canal, compressing the thecal sac to the right aspect of the canal. Disc bulging and mild facet hypertrophy result in mild right greater than left neural foraminal stenosis. L5-S1: Disc bulging, endplate spurring, disc space height loss, and left greater than right facet hypertrophy result in moderate to  severe bilateral neural foraminal stenosis without spinal stenosis. IMPRESSION: 1. Massive L4-5 disc extrusion which completely fills the spinal canal. Severe thecal sac compression. Neurosurgical consultation is recommended. 2. Advanced L5-S1 disc degeneration with moderate to severe bilateral neural foraminal stenosis. 3. Mild right neural foraminal stenosis at L3-4 due to a disc protrusion. Electronically Signed   By: Logan Bores M.D.   On: 04/23/2017 13:41    Procedures Procedures (including critical care time)  Medications Ordered in ED Medications - No data to display   Initial Impression / Assessment and Plan / ED Course  I have reviewed the triage vital signs and the nursing notes.  Pertinent labs & imaging results that were available during my care of the patient were reviewed by me and considered in my medical decision making (see chart for details).     I spoke with Dr. Alfonso Ramus neurosurgery after the results of the MRI were obtained.  Patient will need admission and surgical intervention this  evening.  Patient has been stable here in the emergency department with no issues.  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

## 2017-04-23 NOTE — Plan of Care (Signed)
  Progressing Safety: Ability to remain free from injury will improve 04/23/2017 2335 - Progressing by Charlena Cross, RN

## 2017-04-23 NOTE — Anesthesia Procedure Notes (Signed)
Procedure Name: Intubation Date/Time: 04/23/2017 6:08 PM Performed by: Purvis Kilts, CRNA Pre-anesthesia Checklist: Patient identified, Emergency Drugs available, Suction available, Patient being monitored and Timeout performed Patient Re-evaluated:Patient Re-evaluated prior to induction Oxygen Delivery Method: Circle system utilized Preoxygenation: Pre-oxygenation with 100% oxygen Induction Type: IV induction Ventilation: Mask ventilation without difficulty Laryngoscope Size: Mac and 4 Grade View: Grade II Tube type: Oral Tube size: 7.5 mm Number of attempts: 1 Airway Equipment and Method: Stylet Placement Confirmation: ETT inserted through vocal cords under direct vision,  positive ETCO2 and breath sounds checked- equal and bilateral Secured at: 22 cm Tube secured with: Tape Dental Injury: Teeth and Oropharynx as per pre-operative assessment

## 2017-04-23 NOTE — Transfer of Care (Signed)
Immediate Anesthesia Transfer of Care Note  Patient: Patrick Thompson  Procedure(s) Performed: L4-5 microdiscectomy (Bilateral Back)  Patient Location: PACU  Anesthesia Type:General  Level of Consciousness: awake, alert  and oriented  Airway & Oxygen Therapy: Patient connected to nasal cannula oxygen  Post-op Assessment: Report given to RN, Post -op Vital signs reviewed and stable and Patient moving all extremities X 4  Post vital signs: Reviewed and stable  Last Vitals:  Vitals:   04/23/17 1731 04/23/17 2028  BP: (!) 134/91   Pulse: 73 86  Resp: 16 14  Temp:  (!) 36.1 C  SpO2: 99%     Last Pain:  Vitals:   04/23/17 0858  TempSrc:   PainSc: 8          Complications: No apparent anesthesia complications

## 2017-04-23 NOTE — ED Triage Notes (Signed)
Pt reports being dx with with sciatica 12/7 with no improvement. PT states some tingling to right leg and urinary incontinence when standing.

## 2017-04-23 NOTE — ED Notes (Signed)
Neurologist at bedside. 

## 2017-04-23 NOTE — ED Notes (Signed)
Patrick Thompson in report that consent hasn't been signed or transcribed into computer.  Blank form sent with him.

## 2017-04-23 NOTE — ED Notes (Signed)
Bladder scan and void performed. Initial scan read >559 ml, voided ~350 ml in urinal, final scan read >340 ml

## 2017-04-23 NOTE — H&P (Signed)
Patrick Thompson is an 66 y.o. male.   Chief Complaint: back and right greater left leg pain HPI: 66 year old gentleman with no significant past medical history is had a month of progressive worsening back pain should the knee Patrick Thompson is lumbar strain and discharged however last week or so as a progress worsening pain with radiation both buttocks his feet a bit numb and is been experiencing episodic incontinence when he's been standing and ambulatory. Patient has been taken only ibuprofen for pain.  Past Medical History:  Diagnosis Date  . Hypertension    No longer on medication    History reviewed. No pertinent surgical history.  Family History  Problem Relation Age of Onset  . Hypertension Mother   . Cancer Father        Prostate   . Heart disease Brother        34 brother, MI in late 34s   Social History:  reports that he has been smoking cigarettes.  He has a 4.00 pack-year smoking history. he has never used smokeless tobacco. He reports that he drinks alcohol. He reports that he does not use drugs.  Allergies: No Known Allergies   (Not in a hospital admission)  Results for orders placed or performed during the hospital encounter of 04/23/17 (from the past 48 hour(s))  Basic metabolic panel     Status: Abnormal   Collection Time: 04/23/17  9:37 AM  Result Value Ref Range   Sodium 138 135 - 145 mmol/L   Potassium 3.6 3.5 - 5.1 mmol/L   Chloride 105 101 - 111 mmol/L   CO2 24 22 - 32 mmol/L   Glucose, Bld 106 (H) 65 - 99 mg/dL   BUN 21 (H) 6 - 20 mg/dL   Creatinine, Ser 1.32 (H) 0.61 - 1.24 mg/dL   Calcium 9.2 8.9 - 10.3 mg/dL   GFR calc non Af Amer 55 (L) >60 mL/min   GFR calc Af Amer >60 >60 mL/min    Comment: (NOTE) The eGFR has been calculated using the CKD EPI equation. This calculation has not been validated in all clinical situations. eGFR's persistently <60 mL/min signify possible Chronic Kidney Disease.    Anion gap 9 5 - 15  CBC with Differential      Status: None   Collection Time: 04/23/17  9:37 AM  Result Value Ref Range   WBC 7.4 4.0 - 10.5 K/uL   RBC 5.02 4.22 - 5.81 MIL/uL   Hemoglobin 14.7 13.0 - 17.0 g/dL   HCT 42.8 39.0 - 52.0 %   MCV 85.3 78.0 - 100.0 fL   MCH 29.3 26.0 - 34.0 pg   MCHC 34.3 30.0 - 36.0 g/dL   RDW 13.5 11.5 - 15.5 %   Platelets 299 150 - 400 K/uL   Neutrophils Relative % 69 %   Neutro Abs 5.2 1.7 - 7.7 K/uL   Lymphocytes Relative 23 %   Lymphs Abs 1.7 0.7 - 4.0 K/uL   Monocytes Relative 6 %   Monocytes Absolute 0.5 0.1 - 1.0 K/uL   Eosinophils Relative 2 %   Eosinophils Absolute 0.1 0.0 - 0.7 K/uL   Basophils Relative 0 %   Basophils Absolute 0.0 0.0 - 0.1 K/uL  Urinalysis, Routine w reflex microscopic     Status: None   Collection Time: 04/23/17  9:54 AM  Result Value Ref Range   Color, Urine YELLOW YELLOW   APPearance CLEAR CLEAR   Specific Gravity, Urine 1.021 1.005 - 1.030   pH  5.0 5.0 - 8.0   Glucose, UA NEGATIVE NEGATIVE mg/dL   Hgb urine dipstick NEGATIVE NEGATIVE   Bilirubin Urine NEGATIVE NEGATIVE   Ketones, ur NEGATIVE NEGATIVE mg/dL   Protein, ur NEGATIVE NEGATIVE mg/dL   Nitrite NEGATIVE NEGATIVE   Leukocytes, UA NEGATIVE NEGATIVE   Mr Lumbar Spine Wo Contrast  Result Date: 04/23/2017 CLINICAL DATA:  Back pain. Right leg tingling. Urinary incontinence. EXAM: MRI LUMBAR SPINE WITHOUT CONTRAST TECHNIQUE: Multiplanar, multisequence MR imaging of the lumbar spine was performed. No intravenous contrast was administered. COMPARISON:  None. FINDINGS: Segmentation:  Standard. Alignment:  Trace retrolisthesis of L4 on L5. Vertebrae: No fracture or suspicious osseous lesion. Multilevel type 2 degenerative endplate changes, greatest at L5-S1 where there is also mild type 1 change as well. Conus medullaris and cauda equina: Conus extends to the L1 level. Conus and cauda equina appear normal. Paraspinal and other soft tissues: Prominent bladder distention. Disc levels: Disc desiccation throughout  the lumbar spine, greatest at L2-3 and L5-S1. Mild disc space narrowing at L4-5 with severe narrowing at L5-S1. T12-L1: Negative. L1-2: Minimal disc bulging, tiny central/ left central disc extrusion, and mild facet hypertrophy without stenosis. L2-3: Mild disc bulging and mild facet hypertrophy without stenosis. L3-4: Disc bulging, a right foraminal to extraforaminal disc protrusion, and mild facet hypertrophy result in mild right neural foraminal stenosis with mild deflection of the exiting right L3 nerve. No spinal stenosis. L4-5: There is a massive left paracentral disc extrusion with superior migration to the L4 superior endplate level. This completely obliterates the spinal canal, compressing the thecal sac to the right aspect of the canal. Disc bulging and mild facet hypertrophy result in mild right greater than left neural foraminal stenosis. L5-S1: Disc bulging, endplate spurring, disc space height loss, and left greater than right facet hypertrophy result in moderate to severe bilateral neural foraminal stenosis without spinal stenosis. IMPRESSION: 1. Massive L4-5 disc extrusion which completely fills the spinal canal. Severe thecal sac compression. Neurosurgical consultation is recommended. 2. Advanced L5-S1 disc degeneration with moderate to severe bilateral neural foraminal stenosis. 3. Mild right neural foraminal stenosis at L3-4 due to a disc protrusion. Electronically Signed   By: Logan Bores M.D.   On: 04/23/2017 13:41    Review of Systems  Musculoskeletal: Positive for back pain, joint pain and myalgias.  Neurological: Positive for sensory change.    Blood pressure (!) 126/100, pulse 71, temperature 98.2 F (36.8 C), temperature source Oral, resp. rate 17, height '5\' 10"'  (1.778 m), weight 81.6 kg (180 lb), SpO2 99 %. Physical Exam  Constitutional: He is oriented to person, place, and time. He appears well-developed and well-nourished.  HENT:  Head: Normocephalic.  Eyes: Pupils are  equal, round, and reactive to light.  Neck: Normal range of motion.  Respiratory: Effort normal.  GI: Soft.  Musculoskeletal: Normal range of motion.  Neurological: He is alert and oriented to person, place, and time. He has normal strength. GCS eye subscore is 4. GCS verbal subscore is 5. GCS motor subscore is 6.  Possibly slight dorsiflexion weakness on the left at 4+ out of 5 but otherwise 5 out of 5 EHL is 5 out of 5 in his lower extremities     Assessment/Plan 66 year old presented the ER with a progress worsening back bilateral leg pain imaging shows a very large disc herniation with severe thecal sac compression. He does have numbness in his feet and numbness in his legs and an episodic incontinence when standing. Clinical exam is  suggestive of cauda equina syndrome. I recommended bilateral laminotomies and discectomy have extensively gone over the risks and benefits of the operation with him as well as perioperative course expectations of outcome and alternatives of surgery and he understands and agrees to proceed forward.  Katanya Schlie P, MD 04/23/2017, 4:35 PM

## 2017-04-23 NOTE — Anesthesia Preprocedure Evaluation (Addendum)
Anesthesia Evaluation  Patient identified by MRN, date of birth, ID band Patient awake    Reviewed: Allergy & Precautions, NPO status , Patient's Chart, lab work & pertinent test results  History of Anesthesia Complications Negative for: history of anesthetic complications  Airway Mallampati: II  TM Distance: >3 FB Neck ROM: Full    Dental  (+) Poor Dentition, Missing, Loose, Dental Advisory Given   Pulmonary Current Smoker,    breath sounds clear to auscultation       Cardiovascular hypertension (no longer requires medication), (-) angina Rhythm:Regular Rate:Normal     Neuro/Psych Back pain with HNP    GI/Hepatic negative GI ROS, Neg liver ROS,   Endo/Other  negative endocrine ROS  Renal/GU negative Renal ROS     Musculoskeletal   Abdominal   Peds  Hematology   Anesthesia Other Findings   Reproductive/Obstetrics                            Anesthesia Physical Anesthesia Plan  ASA: II  Anesthesia Plan: General   Post-op Pain Management:    Induction: Intravenous  PONV Risk Score and Plan: 2 and Ondansetron and Dexamethasone  Airway Management Planned: Oral ETT  Additional Equipment:   Intra-op Plan:   Post-operative Plan: Extubation in OR  Informed Consent: I have reviewed the patients History and Physical, chart, labs and discussed the procedure including the risks, benefits and alternatives for the proposed anesthesia with the patient or authorized representative who has indicated his/her understanding and acceptance.   Dental advisory given  Plan Discussed with: CRNA and Surgeon  Anesthesia Plan Comments: (Plan routine monitors, GETA)        Anesthesia Quick Evaluation

## 2017-04-23 NOTE — ED Notes (Signed)
Patient transported to MRI 

## 2017-04-23 NOTE — Op Note (Signed)
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  EBL: Minimal  History of present illness: Patient is a 66 year old gentleman who's had a month and progress worsening back bilateral leg pain with numbness in his feet and episodic incontinence when he was standing and ambulate. Workup revealed a very large disc herniation L4-5 with complete obliteration of his spinal canal and compression of his thecal sac. Due to patient's progression of clinical syndrome imaging findings and recommended emergent decompression and discectomy. And due to the large compressive nature and is conical syndrome I recommended bilateral decompression with unilateral discectomy. I extensively went over the risks and benefits of the operation with the patient as well as perioperative course expectations of outcome alternatives of surgery and he understood and agreed to proceed forward.  Operative procedure: Patient brought in the or was induced under general anesthesia positioned prone the Wilson frame his back was prepped and draped in routine sterile fashion. Impression localizing the appropriate level so after infiltration of 10 mL lidocaine with epi a midline incision was made and Bovie left car was used to consist subcutaneous tissue and subperiosteal dissections care lamina of L4 and L5 bilaterally. Interoperative x-ray confirmed initially had an indication of 34 disc space so I repositioned retractor 1 interspace inferiorly shot another x-ray to confirm the correct level. Then utilizing a high-speed drill and drilled down to laminotomies on each side and then with a 3 and from the Kerrison punch laminotomy was begun the ligament flavum was identified and  removed in piecemeal fashion exposing the thecal sac on both sides under biting both medial gutter was allowed identification of the L5 pedicles bilaterally there was marked facet arthropathy causing severe hourglass compression of thecal sac from spondylosis this was removed decompressing and unroofing the foramen then working on the left side and the disc herniation medially presented itself and with a large free fragment just superior to the disc space teased out several large fragments from behind the thecal sac and behind the L4 vertebral body with a coronary dilator nerve hook and ball probe. After all these large fibrous cannot thecal sac was significantly decompressed from the left thigh into the disc space and cleaned out radically removing additional large amount of central disc herniation. Then inspected the foramen at the foramen on thecal sac and left side was widely patent I then looked on the right inspected the right L5 foramen on thecal sac and it was slack no confirming no further additional fragments. The wounds and copious irrigated meticulous hemostasis was maintained Gelfoam was overlaid top of the dura the muscle fascia proximate layers with after Vicryl skin was closed running 4 subcuticular Dermabond benzo and Steri-Strips and a sterile dressing was applied patient recovered in stable condition. At the end the case all needle counts sponge counts were correct.

## 2017-04-24 ENCOUNTER — Encounter (HOSPITAL_COMMUNITY): Payer: Self-pay | Admitting: Neurosurgery

## 2017-04-24 MED ORDER — PANTOPRAZOLE SODIUM 40 MG PO TBEC
40.0000 mg | DELAYED_RELEASE_TABLET | Freq: Every day | ORAL | Status: DC
  Administered 2017-04-24: 40 mg via ORAL
  Filled 2017-04-24: qty 1

## 2017-04-24 MED ORDER — OXYCODONE HCL 10 MG PO TABS: 10.0000 mg | ORAL_TABLET | ORAL | 0 refills | Status: DC | PRN

## 2017-04-24 MED ORDER — CYCLOBENZAPRINE HCL 5 MG PO TABS: 5.0000 mg | ORAL_TABLET | Freq: Every evening | ORAL | 0 refills | Status: DC | PRN

## 2017-04-24 NOTE — Evaluation (Signed)
Physical Therapy Evaluation Patient Details Name: Patrick Thompson MRN: [id number] DOB: [birth year] Today's Date: 04/24/2017   History of Present Illness  Pt is a 66 y/o male s/p bilateral decompressive laminectomies L4-5 and left sided microdiscectomy with microdissection of L5 nerve roots. PMH including but not limited to HTN.  Clinical Impression  Pt presented supine in bed with HOB elevated, awake and willing to participate in therapy session. Prior to admission, pt reported that he was independent with all functional mobility and ADLs. Pt ambulated in hallway with use of RW and supervision for safety. Pt also successfully completed stair training this session. PT reviewed 3/3 back precautions with pt throughout session as well. No further acute PT needs identified at this time. PT signing off.     Follow Up Recommendations No PT follow up    Equipment Recommendations  Rolling walker with 5" wheels    Recommendations for Other Services       Precautions / Restrictions Precautions Precautions: Back;Fall Precaution Booklet Issued: No(OT provided) Precaution Comments: PT reviewed 3/3 back precautions with pt throughout Restrictions Weight Bearing Restrictions: No      Mobility  Bed Mobility Overal bed mobility: Needs Assistance Bed Mobility: Rolling;Sidelying to Sit Rolling: Supervision Sidelying to sit: Supervision     Sit to sidelying: Min guard General bed mobility comments: supervision for safety  Transfers Overall transfer level: Needs assistance Equipment used: Rolling walker (2 wheeled) Transfers: Sit to/from Stand Sit to Stand: Supervision         General transfer comment: cueing for technique, supervision for safety  Ambulation/Gait Ambulation/Gait assistance: Supervision Ambulation Distance (Feet): 500 Feet Assistive device: Rolling walker (2 wheeled) Gait Pattern/deviations: Step-through pattern;Decreased stride length;Trunk flexed Gait velocity:  decreased Gait velocity interpretation: Below normal speed for age/gender General Gait Details: slow, cautious, steady gait with use of RW, supervision for safety  Stairs Stairs: Yes Stairs assistance: Min guard Stair Management: One rail Right;Step to pattern;Sideways;Forwards Number of Stairs: 10 General stair comments: min guard for safety, no LOB or need for physical assistance, cueing for technique  Wheelchair Mobility    Modified Rankin (Stroke Patients Only)       Balance Overall balance assessment: Needs assistance Sitting-balance support: Feet supported Sitting balance-Leahy Scale: Good     Standing balance support: During functional activity;Bilateral upper extremity supported Standing balance-Leahy Scale: Poor Standing balance comment: reliant on bilateral UEs on RW                             Pertinent Vitals/Pain Pain Assessment: Faces Pain Score: 1  Faces Pain Scale: Hurts a little bit Pain Location: back, bilateral buttocks Pain Descriptors / Indicators: Sore Pain Intervention(s): Monitored during session;Repositioned    Home Living Family/patient expects to be discharged to:: Private residence Living Arrangements: Alone Available Help at Discharge: Family;Available PRN/intermittently(Son) Type of Home: Mobile home Home Access: Stairs to enter   Entrance Stairs-Number of Steps: 5 Home Layout: One level Home Equipment: None      Prior Function Level of Independence: Independent         Comments: Pt states that his son can assist intermittently when d/c home.      Hand Dominance   Dominant Hand: Right    Extremity/Trunk Assessment   Upper Extremity Assessment Upper Extremity Assessment: Defer to OT evaluation    Lower Extremity Assessment Lower Extremity Assessment: Generalized weakness    Cervical / Trunk Assessment Cervical / Trunk Assessment: Other exceptions Cervical /  Trunk Exceptions: s/p lumbar sx   Communication   Communication: No difficulties  Cognition Arousal/Alertness: Awake/alert Behavior During Therapy: WFL for tasks assessed/performed Overall Cognitive Status: Within Functional Limits for tasks assessed                                        General Comments      Exercises     Assessment/Plan    PT Assessment Patent does not need any further PT services  PT Problem List         PT Treatment Interventions      PT Goals (Current goals can be found in the Care Plan section)  Acute Rehab PT Goals Patient Stated Goal: return home    Frequency     Barriers to discharge        Co-evaluation               AM-PAC PT "6 Clicks" Daily Activity  Outcome Measure Difficulty turning over in bed (including adjusting bedclothes, sheets and blankets)?: None Difficulty moving from lying on back to sitting on the side of the bed? : None Difficulty sitting down on and standing up from a chair with arms (e.g., wheelchair, bedside commode, etc,.)?: Unable Help needed moving to and from a bed to chair (including a wheelchair)?: None Help needed walking in hospital room?: None Help needed climbing 3-5 steps with a railing? : A Little 6 Click Score: 20    End of Session Equipment Utilized During Treatment: Gait belt Activity Tolerance: Patient tolerated treatment well Patient left: with call bell/phone within reach;Other (comment)(sitting EOB) Nurse Communication: Mobility status PT Visit Diagnosis: Pain Pain - part of body: (back)    Time: 1638-4665 PT Time Calculation (min) (ACUTE ONLY): 18 min   Charges:   PT Evaluation $PT Eval Low Complexity: 1 Low     PT G Codes:        Wright, PT, DPT [place] 04/24/2017, 10:45 AM

## 2017-04-24 NOTE — Progress Notes (Signed)
Subjective: Patient reports Patient well significant improvement in lower extremity numbness and tingling he is inflammatory he is no longer having incontinence episodes. He also however has not voided since surgery.  Objective: Vital signs in last 24 hours: Temp:  [97 F (36.1 C)-98.2 F (36.8 C)] 97.7 F (36.5 C) (12/19 0400) Pulse Rate:  [62-99] 62 (12/19 0400) Resp:  [11-20] 20 (12/19 0400) BP: (117-150)/(75-100) 117/75 (12/19 0400) SpO2:  [98 %-100 %] 99 % (12/19 0400) Weight:  [81.6 kg (180 lb)] 81.6 kg (180 lb) (12/18 0831)  Intake/Output from previous day: 12/18 0701 - 12/19 0700 In: 903 [I.V.:903] Out: 150 [Blood:150] Intake/Output this shift: Total I/O In: 903 [I.V.:903] Out: 150 [Blood:150]  Strength out of 5 wound clean dry and intact  Lab Results: Recent Labs    04/23/17 0937  WBC 7.4  HGB 14.7  HCT 42.8  PLT 299   BMET Recent Labs    04/23/17 0937  NA 138  K 3.6  CL 105  CO2 24  GLUCOSE 106*  BUN 21*  CREATININE 1.32*  CALCIUM 9.2    Studies/Results: Dg Lumbar Spine 2-3 Views  Result Date: 04/23/2017 CLINICAL DATA:  L4-L5 microdiskectomy. EXAM: LUMBAR SPINE - 2-3 VIEW COMPARISON:  Lumbar spine MRI earlier this day. FINDINGS: Three cross-table lateral views the lumbar spine obtained in the operating room. Image labeled #1 demonstrates surgical instrument localizing posterior to L5-S1. Image # 2 demonstrates surgical instruments localizing posterior to L4. Image # 3 demonstrates surgical instruments localizing posterior to L5. IMPRESSION: Cross-table lateral views of the lumbar spine for intraoperative localization, localizing posterior to L4 and L5. Electronically Signed   By: Jeb Levering M.D.   On: 04/23/2017 21:46   Mr Lumbar Spine Wo Contrast  Result Date: 04/23/2017 CLINICAL DATA:  Back pain. Right leg tingling. Urinary incontinence. EXAM: MRI LUMBAR SPINE WITHOUT CONTRAST TECHNIQUE: Multiplanar, multisequence MR imaging of the lumbar  spine was performed. No intravenous contrast was administered. COMPARISON:  None. FINDINGS: Segmentation:  Standard. Alignment:  Trace retrolisthesis of L4 on L5. Vertebrae: No fracture or suspicious osseous lesion. Multilevel type 2 degenerative endplate changes, greatest at L5-S1 where there is also mild type 1 change as well. Conus medullaris and cauda equina: Conus extends to the L1 level. Conus and cauda equina appear normal. Paraspinal and other soft tissues: Prominent bladder distention. Disc levels: Disc desiccation throughout the lumbar spine, greatest at L2-3 and L5-S1. Mild disc space narrowing at L4-5 with severe narrowing at L5-S1. T12-L1: Negative. L1-2: Minimal disc bulging, tiny central/ left central disc extrusion, and mild facet hypertrophy without stenosis. L2-3: Mild disc bulging and mild facet hypertrophy without stenosis. L3-4: Disc bulging, a right foraminal to extraforaminal disc protrusion, and mild facet hypertrophy result in mild right neural foraminal stenosis with mild deflection of the exiting right L3 nerve. No spinal stenosis. L4-5: There is a massive left paracentral disc extrusion with superior migration to the L4 superior endplate level. This completely obliterates the spinal canal, compressing the thecal sac to the right aspect of the canal. Disc bulging and mild facet hypertrophy result in mild right greater than left neural foraminal stenosis. L5-S1: Disc bulging, endplate spurring, disc space height loss, and left greater than right facet hypertrophy result in moderate to severe bilateral neural foraminal stenosis without spinal stenosis. IMPRESSION: 1. Massive L4-5 disc extrusion which completely fills the spinal canal. Severe thecal sac compression. Neurosurgical consultation is recommended. 2. Advanced L5-S1 disc degeneration with moderate to severe bilateral neural foraminal stenosis. 3.  Mild right neural foraminal stenosis at L3-4 due to a disc protrusion. Electronically  Signed   By: Logan Bores M.D.   On: 04/23/2017 13:41    Assessment/Plan: Mobilized today with physical occupational therapy will check a posterior portal residual see how much she is retaining possible may have discharged him with a Foley catheter in place. He does live alone so we'll see what he does with physical therapy and see if he is going to be safe alone at home.  LOS: 1 day     Zephan Beauchaine P 04/24/2017, 6:50 AM

## 2017-04-24 NOTE — Discharge Instructions (Signed)

## 2017-04-24 NOTE — Evaluation (Signed)
Occupational Therapy Evaluation Patient Details Name: Patrick Thompson MRN: [id number] DOB: 04-26-1951 Today's Date: 04/24/2017    History of Present Illness Pt is a 66 y/o male s/p bilateral decompressive laminectomies L4-5and left sided microdicectomyw/ microdissection of L5 nerve roots.    Clinical Impression   Pt admitted as above is currently at supervision-Min guard assist level for ADL's and self care. He lives alone but plans to d/c home with intermittent assist from his adult son. He was educated in back precautions, ADL's (bed mobility, grooming, LB dressing, tub transfer, set-up of kitchen and bathroom etc) and functional mobility/transfers related to ADL's. A handout was issued and reviewed. He should benefit from 3:1 for use at home to assist with adherence to back precautions. Will sign off acute OT at this time.    Follow Up Recommendations  No OT follow up;Supervision - Intermittent    Equipment Recommendations  3 in 1 bedside commode    Recommendations for Other Services       Precautions / Restrictions Precautions Precautions: Back;Fall Precaution Booklet Issued: Yes (comment) Precaution Comments: Issued and reviewed with pt (No bending, no lifting, no twisting) Restrictions Weight Bearing Restrictions: No      Mobility Bed Mobility Overal bed mobility: Needs Assistance Bed Mobility: Rolling;Sidelying to Sit;Sit to Sidelying Rolling: Min guard Sidelying to sit: Min guard     Sit to sidelying: Min guard General bed mobility comments: VC's/Min guard for technique and proper performance. Handout issued and reviewed.   Transfers Overall transfer level: Needs assistance Equipment used: Rolling walker (2 wheeled) Transfers: Sit to/from Stand Sit to Stand: Supervision         General transfer comment: VC's for RW safety and sequencing    Balance Overall balance assessment: No apparent balance deficits (not formally assessed)                                         ADL either performed or assessed with clinical judgement   ADL Overall ADL's : Needs assistance/impaired Eating/Feeding: Independent;Bed level   Grooming: Wash/dry hands;Wash/dry face;Oral care;Standing;Supervision/safety Grooming Details (indicate cue type and reason): Supervision for vc's HE:NIDP precautions during ADL's. Reminders only, no physical assist Upper Body Bathing: Sitting;Set up   Lower Body Bathing: Supervison/ safety;Sit to/from stand;Cueing for back precautions Lower Body Bathing Details (indicate cue type and reason): VC's for safety and adherence to back precautions. Pt able to sit and demo LB bathing. Also discussed use of LH AE PRN. Upper Body Dressing : Set up;Sitting   Lower Body Dressing: Supervision/safety;Cueing for back precautions;Sit to/from stand Lower Body Dressing Details (indicate cue type and reason): Pt demo good technique sitting EOB for LB dressing and bending one leg at a time while keeping back straight. Also discussed use of AE PRN. Toilet Transfer: Supervision/safety;RW;Ambulation;Grab TEFL teacher Details (indicate cue type and reason): VC to keep RW with him as pt attempted to let go of walker and walk toward commode. Pt should benefit from 3:1 use at home to assist with back precautions Toileting- Clothing Manipulation and Hygiene: Sitting/lateral lean;Adhering to back precautions;Sit to/from stand;Min guard     Tub/Shower Transfer Details (indicate cue type and reason): Pt has tub at home. He plans to sponge bathe initially. Discussed technique for getting into/out of tub and use of shower chair and LH AE PRN. He verbalized understanding Functional mobility during ADLs: Supervision/safety;Cueing for safety;Rolling walker General ADL  Comments: Pt was educated in back precautions, he participated in ADL retraining session to include LB ADL's, possible use of AE PRN. Discussed home set-up and he plans to have his  son place frequently used items at countertop height in kitchen, bathroom and refridgerator. Handout was issued and reviewed. He will have PRN assist from his adult son per his report.     Vision Baseline Vision/History: Wears glasses Wears Glasses: At all times Patient Visual Report: No change from baseline       Perception     Praxis      Pertinent Vitals/Pain Pain Assessment: 0-10 Pain Score: 1  Pain Descriptors / Indicators: Sore Pain Intervention(s): Limited activity within patient's tolerance;Monitored during session;Premedicated before session     Hand Dominance Right   Extremity/Trunk Assessment Upper Extremity Assessment Upper Extremity Assessment: Overall WFL for tasks assessed   Lower Extremity Assessment Lower Extremity Assessment: Defer to PT evaluation       Communication Communication Communication: No difficulties   Cognition Arousal/Alertness: Awake/alert Behavior During Therapy: WFL for tasks assessed/performed Overall Cognitive Status: Within Functional Limits for tasks assessed                                     General Comments       Exercises     Shoulder Instructions      Home Living Family/patient expects to be discharged to:: Private residence Living Arrangements: Alone Available Help at Discharge: Family;Available PRN/intermittently(Son) Type of Home: Mobile home Home Access: Stairs to enter Entrance Stairs-Number of Steps: 5 STE   Home Layout: One level     Bathroom Shower/Tub: Teacher, early years/pre: Standard Bathroom Accessibility: Yes How Accessible: Accessible via walker Home Equipment: None          Prior Functioning/Environment Level of Independence: Independent        Comments: Pt states that his son can assist intermittently when d/c home.         OT Problem List:        OT Treatment/Interventions:      OT Goals(Current goals can be found in the care plan section) Acute  Rehab OT Goals Patient Stated Goal: Go home with Son PRN assist OT Goal Formulation: All assessment and education complete, DC therapy  OT Frequency:     Barriers to D/C:            Co-evaluation              AM-PAC PT "6 Clicks" Daily Activity     Outcome Measure Help from another person eating meals?: None Help from another person taking care of personal grooming?: A Little Help from another person toileting, which includes using toliet, bedpan, or urinal?: A Little Help from another person bathing (including washing, rinsing, drying)?: A Little Help from another person to put on and taking off regular upper body clothing?: None Help from another person to put on and taking off regular lower body clothing?: None 6 Click Score: 21   End of Session Equipment Utilized During Treatment: Gait belt;Rolling walker Nurse Communication: Other (comment)(Recommend 3:1 for home)  Activity Tolerance: Patient tolerated treatment well Patient left: in bed;with call bell/phone within reach  OT Visit Diagnosis: Muscle weakness (generalized) (M62.81);Pain Pain - Right/Left: (Low back)                Time: 7829-5621 OT Time Calculation (min): 26 min  Charges:  OT General Charges $OT Visit: 1 Visit OT Evaluation $OT Eval Low Complexity: 1 Low OT Treatments $Self Care/Home Management : 8-22 mins G-Codes:      Almyra Deforest, OTR/L 04/24/2017, 8:39 AM

## 2017-04-24 NOTE — Discharge Summary (Signed)
  Physician Discharge Summary  Patient ID: Patrick Thompson MRN: [id number] DOB/AGE: [birth year] 66 y.o.  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  Consults: Significant Diagnostic Studies: Treatments: Decompressive laminectomy L4-5 Discharge Exam: Blood pressure (!) 135/104, pulse 68, temperature 97.7 F (36.5 C), temperature source Oral, resp. rate 18, height 5\' 10"  (1.778 m), weight 81.6 kg (180 lb), SpO2 100 %. Strength 5 out of 5 wound clean dry and intact  Disposition: Home   Allergies as of 04/24/2017   No Known Allergies     Medication List    TAKE these medications   cyclobenzaprine 5 MG tablet Commonly known as:  FLEXERIL Take 1 tablet (5 mg total) by mouth at bedtime as needed for muscle spasms. What changed:  Another medication with the same name was added. Make sure you understand how and when to take each.   cyclobenzaprine 5 MG tablet Commonly known as:  FLEXERIL Take 1 tablet (5 mg total) by mouth at bedtime as needed for muscle spasms. What changed:  You were already taking a medication with the same name, and this prescription was added. Make sure you understand how and when to take each.   diclofenac sodium 1 % Gel Commonly known as:  VOLTAREN Apply 2 g topically 4 (four) times daily.   ibuprofen 600 MG tablet Commonly known as:  ADVIL,MOTRIN Take 1 tablet (600 mg total) by mouth every 8 (eight) hours as needed.   methylPREDNISolone 4 MG Tbpk  tablet Commonly known as:  MEDROL DOSEPAK Day 1: 8 mg PO before breakfast, 4 mg after lunch and after dinner, and 8 mg at bedtime  Day 2: 4 mg PO before breakfast, after lunch, and after dinner and 8 mg at bedtime  Day 3: 4 mg PO before breakfast, after lunch, after dinner, and at bedtime  Day 4: 4 mg PO before breakfast, after lunch, and at bedtime  Day 5: 4 mg PO before breakfast and at bedtime  Day 6: 4 mg PO before breakfast   naproxen sodium 220 MG tablet Commonly known as:  ALEVE Take 220 mg by mouth every 12 (twelve) hours.   Oxycodone HCl 10 MG Tabs Take 1 tablet (10 mg total) by mouth every 3 (three) hours as needed for severe pain ((score 7 to 10)).        Signed: Dimond Crotty P 04/24/2017, 11:53 AM

## 2017-04-24 NOTE — Care Management CC44 (Signed)
Condition Code 44 Documentation Completed  Patient Details  Name: Marcia Lepera MRN: [id number] Date of Birth: 11/29/1950   Condition Code 44 given:  Yes Patient signature on Condition Code 44 notice:  Yes Documentation of 2 MD's agreement:  Yes Code 44 added to claim:  Yes    Ninfa Meeker, RN 04/24/2017, 1:47 PM

## 2017-04-24 NOTE — Care Management Obs Status (Signed)
[place] NOTIFICATION   Patient Details  Name: Patrick Thompson MRN: [id number] Date of Birth: 1951/03/22   Medicare Observation Status Notification Given:  Yes    Ninfa Meeker, RN 04/24/2017, 1:47 PM

## 2017-04-24 NOTE — Progress Notes (Signed)
Discharged instructions/education/Rx/AVS given to patient and verbalized understanding. No swelling, no drainage, no redness noted on incision site. MAE well, ambulating with walker. Patient discharged with foley catheter per MD r/t acute urinary retention post surgery. Patient discharged home, his transport finally arrived. D/C via wheelchair.

## 2017-04-25 NOTE — Anesthesia Postprocedure Evaluation (Signed)
Anesthesia Post Note  Patient: Di Kindle  Procedure(s) Performed: L4-5 microdiscectomy (Bilateral Back)     Patient location during evaluation: PACU Anesthesia Type: General Level of consciousness: awake and alert Pain management: pain level controlled Vital Signs Assessment: post-procedure vital signs reviewed and stable Respiratory status: spontaneous breathing, nonlabored ventilation, respiratory function stable and patient connected to nasal cannula oxygen Cardiovascular status: blood pressure returned to baseline and stable Postop Assessment: no apparent nausea or vomiting Anesthetic complications: no    Last Vitals:  Vitals:   04/24/17 2007 04/24/17 2249  BP: 118/81 (!) 129/97  Pulse: 79 79  Resp: 20 18  Temp: 36.8 C 36.6 C  SpO2: 100% 98%    Last Pain:  Vitals:   04/24/17 2249  TempSrc: Oral  PainSc:                  Karyl Kinnier [place]

## 2017-04-25 NOTE — Progress Notes (Signed)
Late entry for missed gcode    04/24/17 0838  OT Time Calculation  OT Start Time (ACUTE ONLY) 0755  OT Stop Time (ACUTE ONLY) 0821  OT Time Calculation (min) 26 min  OT G-codes **NOT FOR INPATIENT CLASS**  Functional Assessment Tool Used Clinical judgement  Functional Limitation Self care  Self Care Current Status (T0211) CI  Self Care Goal Status (Z7356) CI  Self Care Discharge Status (P0141) CI  OT General Charges  $OT Visit 1 Visit  OT Evaluation  $OT Eval Low Complexity 1 Low  OT Treatments  $Self Care/Home Management  8-22 mins   Ezreal Turay A. Ulice Brilliant, M.S., OTR/L Pager: [phone]

## 2017-04-25 NOTE — Progress Notes (Signed)
PT Progress Note Addendum for G-Codes    04/25/17 1005  PT G-Codes **NOT FOR INPATIENT CLASS**  Functional Assessment Tool Used AM-PAC 6 Clicks Basic Mobility;Clinical judgement  Functional Limitation Mobility: Walking and moving around  Mobility: Walking and Moving Around Current Status (L8756) CJ  Mobility: Walking and Moving Around Goal Status (E3329) CH  Mobility: Walking and Moving Around Discharge Status [phone]) [place], [place], DPT [phone]

## 2017-06-07 DIAGNOSIS — R338 Other retention of urine: Secondary | ICD-10-CM | POA: Diagnosis not present

## 2017-06-17 DIAGNOSIS — F1020 Alcohol dependence, uncomplicated: Secondary | ICD-10-CM | POA: Diagnosis not present

## 2017-06-18 DIAGNOSIS — F1020 Alcohol dependence, uncomplicated: Secondary | ICD-10-CM | POA: Diagnosis not present

## 2017-06-19 DIAGNOSIS — F1020 Alcohol dependence, uncomplicated: Secondary | ICD-10-CM | POA: Diagnosis not present

## 2017-06-21 DIAGNOSIS — F1020 Alcohol dependence, uncomplicated: Secondary | ICD-10-CM | POA: Diagnosis not present

## 2017-06-24 DIAGNOSIS — F1020 Alcohol dependence, uncomplicated: Secondary | ICD-10-CM | POA: Diagnosis not present

## 2017-06-25 DIAGNOSIS — F1020 Alcohol dependence, uncomplicated: Secondary | ICD-10-CM | POA: Diagnosis not present

## 2017-06-26 DIAGNOSIS — F1020 Alcohol dependence, uncomplicated: Secondary | ICD-10-CM | POA: Diagnosis not present

## 2017-06-28 ENCOUNTER — Encounter: Payer: Self-pay | Admitting: Family Medicine

## 2017-06-28 ENCOUNTER — Ambulatory Visit (INDEPENDENT_AMBULATORY_CARE_PROVIDER_SITE_OTHER): Payer: Medicare HMO | Admitting: Family Medicine

## 2017-06-28 VITALS — BP 138/82 | HR 62 | Temp 98.0°F | Ht 70.75 in | Wt 192.2 lb

## 2017-06-28 DIAGNOSIS — Z1211 Encounter for screening for malignant neoplasm of colon: Secondary | ICD-10-CM

## 2017-06-28 DIAGNOSIS — Z720 Tobacco use: Secondary | ICD-10-CM

## 2017-06-28 DIAGNOSIS — R7989 Other specified abnormal findings of blood chemistry: Secondary | ICD-10-CM | POA: Diagnosis not present

## 2017-06-28 DIAGNOSIS — Z7689 Persons encountering health services in other specified circumstances: Secondary | ICD-10-CM

## 2017-06-28 DIAGNOSIS — F1020 Alcohol dependence, uncomplicated: Secondary | ICD-10-CM | POA: Diagnosis not present

## 2017-06-28 NOTE — Patient Instructions (Signed)
It was a pleasure to see you today  Please schedule your Medicare Annual Wellness Visit with [facility]  Have your urologist fax me your reports  Follow up in 1 year, sooner if needed or abnormal blood work.

## 2017-06-28 NOTE — Progress Notes (Signed)
Subjective:    Patient ID: Patrick Thompson, male    DOB: 12-30-1950, 67 y.o.   MRN: [id number]  HPI This is a 67 yo male who presents today to establish care. Lives alone. Goes out with friends to a local bar. He gave up alcohol over a year ago.   PCP passed away a couple of years ago. Has not had any recent care.   Had back surgery two months ago Saintclair Halsted)- doing well Had to have urinary catheter and has follow up with urologist next month.   Right forefinger swollen and sore. Thinks he got a metal splinter in it 10 years ago. It only bothers him occasionally when he uses his TV remote.    Last CPE- several years ago PSA- follows enlarged prostate with urology Colonoscopy- agrees to cologuard Tdap- unknown Flu- declines Dental- over due Eye- has appointment next week Exercise- walks, thinks he will be starting PT for his back soon, has appointment with Dr. Saintclair Halsted next week.   No chest pain, no SOB, no cough, no diarrhea or constipation, bad right knee. Numbness and tingling in legs- getting better after surgery.    Past Medical History:  Diagnosis Date  . Hypertension    No longer on medication   Past Surgical History:  Procedure Laterality Date  . LUMBAR LAMINECTOMY/DECOMPRESSION MICRODISCECTOMY Bilateral 04/23/2017   Procedure: L4-5 microdiscectomy;  Surgeon: Kary Kos, MD;  Location: [facility];  Service: Neurosurgery;  Laterality: Bilateral;   Family History  Problem Relation Age of Onset  . Hypertension Mother   . Diabetes Mother   . Cancer Father        Prostate   . Heart disease Brother        25 brother, MI in late 51s    Social History   Tobacco Use  . Smoking status: Current Some Day Smoker    Packs/day: 0.10    Years: 40.00    Pack years: 4.00    Types: Cigarettes  . Smokeless tobacco: Never Used  Substance Use Topics  . Alcohol use: No    Alcohol/week: 0.0 oz    Frequency: Never    Comment: occasionally  . Drug use: No     Review of  Systems Per HPI    Objective:   Physical Exam Physical Exam  Constitutional: Oriented to person, place, and time. He appears well-developed and well-nourished.  HENT:  Head: Normocephalic and atraumatic.  Eyes: Conjunctivae are normal.  Neck: Normal range of motion. Neck supple.  Cardiovascular: Normal rate, regular rhythm and normal heart sounds.   Pulmonary/Chest: Effort normal and breath sounds normal.  Musculoskeletal: Right medial forefinger with firm swelling, skin thickening, no erythema.  Neurological: Alert and oriented to person, place, and time.  Skin: Skin is warm and dry.  Psychiatric: Normal mood and affect. Behavior is normal. Judgment and thought content normal.  Vitals reviewed.     BP 138/82   Pulse 62   Temp 98 F (36.7 C) (Oral)   Ht 5' 10.75" (1.797 m)   Wt 192 lb 4 oz (87.2 kg)   SpO2 98%   BMI 27.00 kg/m      Assessment & Plan:  1. Encounter to establish care - Discussed and encouraged healthy lifestyle choices- adequate sleep, regular exercise, stress management and healthy food choices.  - Will schedule follow up for AWV - Do not think will be able to get outside records as provider passed away years ago   2. Elevated serum creatinine -  Comprehensive metabolic panel - Hemoglobin A1c  3. Screening for colon cancer - he has agreed to [employer], paperwork completed  4. Tobacco abuse - discussed smoking cessation, he is not interested at this time as he feels his tobacco is is very light   Clarene Reamer, FNP-BC  [facility] at [facility], [facility]  06/29/2017 1:59 PM

## 2017-06-29 ENCOUNTER — Encounter: Payer: Self-pay | Admitting: Family Medicine

## 2017-06-29 LAB — COMPREHENSIVE METABOLIC PANEL
AG Ratio: 1.5 (calc) (ref 1.0–2.5)
ALKALINE PHOSPHATASE (APISO): 73 U/L (ref 40–115)
ALT: 12 U/L (ref 9–46)
AST: 16 U/L (ref 10–35)
Albumin: 4.3 g/dL (ref 3.6–5.1)
BILIRUBIN TOTAL: 0.3 mg/dL (ref 0.2–1.2)
BUN: 18 mg/dL (ref 7–25)
CALCIUM: 9.5 mg/dL (ref 8.6–10.3)
CO2: 28 mmol/L (ref 20–32)
Chloride: 106 mmol/L (ref 98–110)
Creat: 1.12 mg/dL (ref 0.70–1.25)
Globulin: 2.8 g/dL (calc) (ref 1.9–3.7)
Glucose, Bld: 96 mg/dL (ref 65–99)
Potassium: 4.3 mmol/L (ref 3.5–5.3)
SODIUM: 140 mmol/L (ref 135–146)
TOTAL PROTEIN: 7.1 g/dL (ref 6.1–8.1)

## 2017-06-29 LAB — HEMOGLOBIN A1C
EAG (MMOL/L): 6.6 (calc)
HEMOGLOBIN A1C: 5.8 %{Hb} — AB (ref <5.7)
MEAN PLASMA GLUCOSE: 120 (calc)

## 2017-07-01 DIAGNOSIS — F1020 Alcohol dependence, uncomplicated: Secondary | ICD-10-CM | POA: Diagnosis not present

## 2017-07-02 DIAGNOSIS — F1020 Alcohol dependence, uncomplicated: Secondary | ICD-10-CM | POA: Diagnosis not present

## 2017-07-03 DIAGNOSIS — F1020 Alcohol dependence, uncomplicated: Secondary | ICD-10-CM | POA: Diagnosis not present

## 2017-07-05 DIAGNOSIS — F1020 Alcohol dependence, uncomplicated: Secondary | ICD-10-CM | POA: Diagnosis not present

## 2017-07-05 DIAGNOSIS — F1220 Cannabis dependence, uncomplicated: Secondary | ICD-10-CM | POA: Diagnosis not present

## 2017-07-05 DIAGNOSIS — F1110 Opioid abuse, uncomplicated: Secondary | ICD-10-CM | POA: Diagnosis not present

## 2017-07-08 DIAGNOSIS — F1110 Opioid abuse, uncomplicated: Secondary | ICD-10-CM | POA: Diagnosis not present

## 2017-07-08 DIAGNOSIS — F1020 Alcohol dependence, uncomplicated: Secondary | ICD-10-CM | POA: Diagnosis not present

## 2017-07-08 DIAGNOSIS — F1220 Cannabis dependence, uncomplicated: Secondary | ICD-10-CM | POA: Diagnosis not present

## 2017-07-09 DIAGNOSIS — F1020 Alcohol dependence, uncomplicated: Secondary | ICD-10-CM | POA: Diagnosis not present

## 2017-07-10 DIAGNOSIS — F1020 Alcohol dependence, uncomplicated: Secondary | ICD-10-CM | POA: Diagnosis not present

## 2017-07-12 DIAGNOSIS — F1020 Alcohol dependence, uncomplicated: Secondary | ICD-10-CM | POA: Diagnosis not present

## 2017-07-15 DIAGNOSIS — F1020 Alcohol dependence, uncomplicated: Secondary | ICD-10-CM | POA: Diagnosis not present

## 2017-07-15 DIAGNOSIS — F1220 Cannabis dependence, uncomplicated: Secondary | ICD-10-CM | POA: Diagnosis not present

## 2017-07-15 DIAGNOSIS — F1110 Opioid abuse, uncomplicated: Secondary | ICD-10-CM | POA: Diagnosis not present

## 2017-07-16 DIAGNOSIS — F1020 Alcohol dependence, uncomplicated: Secondary | ICD-10-CM | POA: Diagnosis not present

## 2017-07-17 DIAGNOSIS — H04123 Dry eye syndrome of bilateral lacrimal glands: Secondary | ICD-10-CM | POA: Diagnosis not present

## 2017-07-17 DIAGNOSIS — H524 Presbyopia: Secondary | ICD-10-CM | POA: Diagnosis not present

## 2017-07-17 DIAGNOSIS — H52223 Regular astigmatism, bilateral: Secondary | ICD-10-CM | POA: Diagnosis not present

## 2017-07-17 DIAGNOSIS — H5203 Hypermetropia, bilateral: Secondary | ICD-10-CM | POA: Diagnosis not present

## 2017-07-17 DIAGNOSIS — H25032 Anterior subcapsular polar age-related cataract, left eye: Secondary | ICD-10-CM | POA: Diagnosis not present

## 2017-07-17 DIAGNOSIS — F1020 Alcohol dependence, uncomplicated: Secondary | ICD-10-CM | POA: Diagnosis not present

## 2017-07-17 DIAGNOSIS — H4322 Crystalline deposits in vitreous body, left eye: Secondary | ICD-10-CM | POA: Diagnosis not present

## 2017-07-19 DIAGNOSIS — F1020 Alcohol dependence, uncomplicated: Secondary | ICD-10-CM | POA: Diagnosis not present

## 2017-07-22 DIAGNOSIS — F1020 Alcohol dependence, uncomplicated: Secondary | ICD-10-CM | POA: Diagnosis not present

## 2017-07-23 DIAGNOSIS — F1020 Alcohol dependence, uncomplicated: Secondary | ICD-10-CM | POA: Diagnosis not present

## 2017-07-23 DIAGNOSIS — F1110 Opioid abuse, uncomplicated: Secondary | ICD-10-CM | POA: Diagnosis not present

## 2017-07-23 DIAGNOSIS — F1220 Cannabis dependence, uncomplicated: Secondary | ICD-10-CM | POA: Diagnosis not present

## 2017-07-26 DIAGNOSIS — F1020 Alcohol dependence, uncomplicated: Secondary | ICD-10-CM | POA: Diagnosis not present

## 2017-07-29 DIAGNOSIS — F1020 Alcohol dependence, uncomplicated: Secondary | ICD-10-CM | POA: Diagnosis not present

## 2017-07-30 DIAGNOSIS — F1020 Alcohol dependence, uncomplicated: Secondary | ICD-10-CM | POA: Diagnosis not present

## 2017-07-30 DIAGNOSIS — F1110 Opioid abuse, uncomplicated: Secondary | ICD-10-CM | POA: Diagnosis not present

## 2017-07-30 DIAGNOSIS — F1220 Cannabis dependence, uncomplicated: Secondary | ICD-10-CM | POA: Diagnosis not present

## 2017-07-31 DIAGNOSIS — F1020 Alcohol dependence, uncomplicated: Secondary | ICD-10-CM | POA: Diagnosis not present

## 2017-08-01 DIAGNOSIS — F1020 Alcohol dependence, uncomplicated: Secondary | ICD-10-CM | POA: Diagnosis not present

## 2017-08-02 DIAGNOSIS — F1020 Alcohol dependence, uncomplicated: Secondary | ICD-10-CM | POA: Diagnosis not present

## 2017-08-05 DIAGNOSIS — F1220 Cannabis dependence, uncomplicated: Secondary | ICD-10-CM | POA: Diagnosis not present

## 2017-08-05 DIAGNOSIS — F1020 Alcohol dependence, uncomplicated: Secondary | ICD-10-CM | POA: Diagnosis not present

## 2017-08-05 DIAGNOSIS — F1110 Opioid abuse, uncomplicated: Secondary | ICD-10-CM | POA: Diagnosis not present

## 2017-08-06 DIAGNOSIS — F1020 Alcohol dependence, uncomplicated: Secondary | ICD-10-CM | POA: Diagnosis not present

## 2017-08-07 DIAGNOSIS — F1020 Alcohol dependence, uncomplicated: Secondary | ICD-10-CM | POA: Diagnosis not present

## 2017-08-09 DIAGNOSIS — F1020 Alcohol dependence, uncomplicated: Secondary | ICD-10-CM | POA: Diagnosis not present

## 2017-08-12 DIAGNOSIS — F1110 Opioid abuse, uncomplicated: Secondary | ICD-10-CM | POA: Diagnosis not present

## 2017-08-12 DIAGNOSIS — F1020 Alcohol dependence, uncomplicated: Secondary | ICD-10-CM | POA: Diagnosis not present

## 2017-08-12 DIAGNOSIS — F1220 Cannabis dependence, uncomplicated: Secondary | ICD-10-CM | POA: Diagnosis not present

## 2017-08-13 DIAGNOSIS — F1020 Alcohol dependence, uncomplicated: Secondary | ICD-10-CM | POA: Diagnosis not present

## 2017-08-14 DIAGNOSIS — F1020 Alcohol dependence, uncomplicated: Secondary | ICD-10-CM | POA: Diagnosis not present

## 2017-08-16 DIAGNOSIS — F1020 Alcohol dependence, uncomplicated: Secondary | ICD-10-CM | POA: Diagnosis not present

## 2017-09-06 DIAGNOSIS — Z125 Encounter for screening for malignant neoplasm of prostate: Secondary | ICD-10-CM | POA: Diagnosis not present

## 2017-09-13 DIAGNOSIS — R3914 Feeling of incomplete bladder emptying: Secondary | ICD-10-CM | POA: Diagnosis not present

## 2017-09-13 DIAGNOSIS — Z125 Encounter for screening for malignant neoplasm of prostate: Secondary | ICD-10-CM | POA: Diagnosis not present

## 2017-11-22 ENCOUNTER — Encounter: Payer: Medicare HMO | Admitting: Family Medicine

## 2018-01-02 ENCOUNTER — Encounter: Payer: Self-pay | Admitting: Emergency Medicine

## 2018-01-02 ENCOUNTER — Emergency Department
Admission: EM | Admit: 2018-01-02 | Discharge: 2018-01-02 | Disposition: A | Discharge status: Home / Self Care | Payer: Medicare HMO | Attending: Emergency Medicine | Admitting: Emergency Medicine

## 2018-01-02 ENCOUNTER — Other Ambulatory Visit: Payer: Self-pay

## 2018-01-02 DIAGNOSIS — T782XXA Anaphylactic shock, unspecified, initial encounter: Secondary | ICD-10-CM

## 2018-01-02 DIAGNOSIS — Y9289 Other specified places as the place of occurrence of the external cause: Secondary | ICD-10-CM | POA: Insufficient documentation

## 2018-01-02 DIAGNOSIS — Y999 Unspecified external cause status: Secondary | ICD-10-CM | POA: Insufficient documentation

## 2018-01-02 DIAGNOSIS — Z79899 Other long term (current) drug therapy: Secondary | ICD-10-CM | POA: Diagnosis not present

## 2018-01-02 DIAGNOSIS — S0086XA Insect bite (nonvenomous) of other part of head, initial encounter: Secondary | ICD-10-CM | POA: Insufficient documentation

## 2018-01-02 DIAGNOSIS — L299 Pruritus, unspecified: Secondary | ICD-10-CM | POA: Diagnosis not present

## 2018-01-02 DIAGNOSIS — Y9389 Activity, other specified: Secondary | ICD-10-CM | POA: Diagnosis not present

## 2018-01-02 DIAGNOSIS — F17210 Nicotine dependence, cigarettes, uncomplicated: Secondary | ICD-10-CM | POA: Insufficient documentation

## 2018-01-02 DIAGNOSIS — R0902 Hypoxemia: Secondary | ICD-10-CM | POA: Diagnosis not present

## 2018-01-02 DIAGNOSIS — R609 Edema, unspecified: Secondary | ICD-10-CM | POA: Diagnosis not present

## 2018-01-02 DIAGNOSIS — W57XXXA Bitten or stung by nonvenomous insect and other nonvenomous arthropods, initial encounter: Secondary | ICD-10-CM | POA: Insufficient documentation

## 2018-01-02 DIAGNOSIS — I959 Hypotension, unspecified: Secondary | ICD-10-CM | POA: Diagnosis not present

## 2018-01-02 DIAGNOSIS — T63484A Toxic effect of venom of other arthropod, undetermined, initial encounter: Secondary | ICD-10-CM | POA: Diagnosis not present

## 2018-01-02 DIAGNOSIS — I10 Essential (primary) hypertension: Secondary | ICD-10-CM | POA: Insufficient documentation

## 2018-01-02 MED ORDER — EPINEPHRINE 0.3 MG/0.3ML IJ SOAJ: 0.3000 mg | Freq: Once | INTRAMUSCULAR | 0 refills | Status: AC

## 2018-01-02 MED ORDER — PREDNISONE 20 MG PO TABS: 60.0000 mg | ORAL_TABLET | Freq: Every day | ORAL | 0 refills | Status: DC

## 2018-01-02 MED ORDER — METHYLPREDNISOLONE SODIUM SUCC 125 MG IJ SOLR
125.0000 mg | Freq: Once | INTRAMUSCULAR | Status: AC
  Administered 2018-01-02: 125 mg via INTRAVENOUS
  Filled 2018-01-02: qty 2

## 2018-01-02 NOTE — ED Notes (Signed)
ED Provider at bedside. 

## 2018-01-02 NOTE — ED Notes (Signed)
Meal tray given to patient at this time.

## 2018-01-02 NOTE — ED Provider Notes (Signed)
[facility] Emergency Department Provider Note  ____________________________________________   First MD Initiated Contact with Patient 01/02/18 1513     (approximate)  I have reviewed the triage vital signs and the nursing notes.   HISTORY  Chief Complaint Allergic Reaction    HPI Patrick Thompson is a 67 y.o. male with no contributory past medical history who presents for evaluation after getting stung by an insect over his left eye.  He states that he got out of the car and immediately he saw an insect (not sure what kind) that landed just to the side of his left eye and stung him on the face.  Almost immediately he felt onset of generalized itching that was worse at the face and as 15 minutes or so past he began to feel lightheaded, ringing in his ears, and some generalized numbness.  He denies shortness of breath, chest pain, nausea, vomiting, abdominal pain, diarrhea, and difficulty swallowing or speaking.  The itching was severe, the facial swelling was moderate, and the onset of the symptoms was acute.  He did not take any medication at home but called EMS as the symptoms became more severe.  When EMS arrived they established an IV and found that his blood pressure was about 74/48 with hives all over and some swelling on the side of his face.  They administered epinephrine 0.3 mg intramuscular in the left deltoid and gave Benadryl 50 mg IV.  The patient is feeling better now and other than some mild residual itching on the side of his face and some swelling, he has no symptoms and says he feels much better.  He says that he has been stung by bees and other insects in the past but it has been many years.  He has never had an anaphylactic reaction to anything previously.  Past Medical History:  Diagnosis Date  . BPH (benign prostatic hyperplasia)   . Hypertension    No longer on medication  . Tobacco abuse     Patient Active Problem List   Diagnosis Date  Noted  . HNP (herniated nucleus pulposus), lumbar 04/23/2017  . Syncope 10/20/2015    Past Surgical History:  Procedure Laterality Date  . LUMBAR LAMINECTOMY/DECOMPRESSION MICRODISCECTOMY Bilateral 04/23/2017   Procedure: L4-5 microdiscectomy;  Surgeon: Kary Kos, MD;  Location: [place];  Service: Neurosurgery;  Laterality: Bilateral;    Prior to Admission medications   Medication Sig Start Date End Date Taking? Authorizing Provider  predniSONE (DELTASONE) 20 MG tablet Take 3 tablets (60 mg total) by mouth daily. 01/02/18   Hinda Kehr, MD  tamsulosin (FLOMAX) 0.4 MG CAPS capsule Take 1 capsule by mouth at bedtime. 06/07/17   [provider]    Allergies Bee venom  Family History  Problem Relation Age of Onset  . Hypertension Mother   . Diabetes Mother   . Cancer Father        Prostate   . Heart disease Brother        44 brother, MI in late 69s    Social History Social History   Tobacco Use  . Smoking status: Current Some Day Smoker    Packs/day: 0.10    Years: 40.00    Pack years: 4.00    Types: Cigarettes  . Smokeless tobacco: Never Used  Substance Use Topics  . Alcohol use: No    Alcohol/week: 0.0 standard drinks    Frequency: Never    Comment: occasionally  . Drug use: No  Review of Systems Constitutional: No fever/chills.  Lightheadedness after insect bite as described above. Eyes: No visual changes. ENT: No sore throat.  No difficulty swallowing. Cardiovascular: Denies chest pain. Respiratory: Denies shortness of breath. Gastrointestinal: No abdominal pain.  No nausea, no vomiting.  No diarrhea.  No constipation. Genitourinary: Negative for dysuria. Musculoskeletal: Negative for neck pain.  Negative for back pain. Integumentary: Generalized itching and rash as well as swelling to the left side of the face and itchiness after being stung by an insect. Neurological: Negative for headaches, focal weakness or  numbness.   ____________________________________________   PHYSICAL EXAM:  VITAL SIGNS: ED Triage Vitals  Enc Vitals Group     BP 01/02/18 1514 137/84     Pulse Rate 01/02/18 1514 74     Resp 01/02/18 1514 14     Temp 01/02/18 1514 97.9 F (36.6 C)     Temp Source 01/02/18 1514 Oral     SpO2 01/02/18 1511 97 %     Weight 01/02/18 1515 90.7 kg (200 lb)     Height 01/02/18 1515 1.778 m (5\' 10" )     Head Circumference --      Peak Flow --      Pain Score 01/02/18 1515 0     Pain Loc --      Pain Edu? --      Excl. in [place]? --     Constitutional: Alert and oriented. Well appearing and in no acute distress. Eyes: Conjunctivae are normal. PERRL. EOMI. periorbital swelling/edema to the left eye Head: Atraumatic. Nose: No congestion/rhinnorhea. Mouth/Throat: Mucous membranes are moist. Neck: No stridor.  No meningeal signs.   Cardiovascular: Normal rate, regular rhythm. Good peripheral circulation. Grossly normal heart sounds. Respiratory: Normal respiratory effort.  No retractions. Lungs CTAB. Gastrointestinal: Soft and nontender. No distention.  Musculoskeletal: No lower extremity tenderness nor edema. No gross deformities of extremities. Neurologic:  Normal speech and language. No gross focal neurologic deficits are appreciated.  Skin:  Skin is warm, dry and intact.  The patient has urticarial rash primarily on the left side of his back but also on the front of his torso.  He has some erythema but no discrete urticaria to his face but he does have periorbital and facial edema but it is all localized around the left orbit. Psychiatric: Mood and affect are normal. Speech and behavior are normal.  ____________________________________________   LABS (all labs ordered are listed, but only abnormal results are displayed)  Labs Reviewed - No data to display ____________________________________________  EKG  None - EKG not ordered by ED  physician ____________________________________________  RADIOLOGY   ED MD interpretation: No indication for imaging  Official radiology report(s): No results found.  ____________________________________________   PROCEDURES  Critical Care performed: No   Procedure(s) performed:   Procedures   ____________________________________________   INITIAL IMPRESSION / ASSESSMENT AND PLAN / ED COURSE  As part of my medical decision making, I reviewed the following data within the [place] notes reviewed and incorporated    Differential diagnosis includes, but is not limited to, anaphylaxis versus milder allergic reaction, drug reaction.  Patient has no new recent medications and only takes Flomax.  Onset was immediate and acute after being stung by an unknown insect.  He was relatively asymptomatic for anaphylaxis but he was hypotensive with a generalized itching rash and swelling to his face.  He received the appropriate treatment prior to arrival with epinephrine 0.3 mg and Benadryl 50 mg  IV.  I have added Solu-Medrol 125 mg IV to the regimen.  I explained to him that we will need to monitor him for 4 to 5 hours to make sure he has no rebound.  His vital signs are currently stable and there is no indication for IV fluids.  He understands and agrees with the plan.  Clinical Course as of Jan 04 23  Thu Jan 02, 2018  1720 I reassessed the patient and he reports no distress.  He still has some swelling around the left eye but it is not painful.  His urticaria has resolved.   [CF]  Fri Jan 03, 2018  0023 Patient was observed in the emergency department for about 7 hours and had no recurrence of anaphylaxis or other symptoms.  He is comfortable with the plan for discharge.  I prescribed medication as described below including prednisone and EpiPen's.   [CF]  0023 I gave my usual and customary return precautions.   [CF]    Clinical Course User Index [CF]  Hinda Kehr, MD    ____________________________________________  FINAL CLINICAL IMPRESSION(S) / ED DIAGNOSES  Final diagnoses:  Anaphylaxis, initial encounter     MEDICATIONS GIVEN DURING THIS VISIT:  Medications  methylPREDNISolone sodium succinate (SOLU-MEDROL) 125 mg/2 mL injection 125 mg (125 mg Intravenous Given 01/02/18 1520)     ED Discharge Orders         Ordered    EPINEPHrine (EPIPEN 2-PAK) 0.3 mg/0.3 mL IJ SOAJ injection   Once     01/02/18 2250    predniSONE (DELTASONE) 20 MG tablet  Daily     01/02/18 2250           Note:  This document was prepared using Dragon voice recognition software and may include unintentional dictation errors.    Hinda Kehr, MD 01/03/18 [phone]

## 2018-01-02 NOTE — ED Triage Notes (Signed)
Pt comes into the ED via [facility] from home where he was stung by a bee over the left eye.  Patient has swelling over the eye, diaphoresis upon EMS arrival, hypotensive at 74/48, and hives present.  Patient denies any swelling of the throat, difficulty breathing, or difficulty swallowing.  Patient in NAD at this time with even and unlabored respirations.  Patient given 25 mg benadryl and .3 epi.

## 2018-01-02 NOTE — Discharge Instructions (Signed)

## 2018-01-08 ENCOUNTER — Ambulatory Visit: Payer: Medicare HMO | Admitting: Family Medicine

## 2018-01-08 ENCOUNTER — Encounter: Payer: Self-pay | Admitting: Family Medicine

## 2018-01-08 ENCOUNTER — Ambulatory Visit (INDEPENDENT_AMBULATORY_CARE_PROVIDER_SITE_OTHER): Payer: Medicare HMO | Admitting: Family Medicine

## 2018-01-08 VITALS — BP 118/78 | HR 78 | Temp 98.3°F | Ht 70.75 in | Wt 204.8 lb

## 2018-01-08 DIAGNOSIS — Z1211 Encounter for screening for malignant neoplasm of colon: Secondary | ICD-10-CM | POA: Diagnosis not present

## 2018-01-08 DIAGNOSIS — T782XXD Anaphylactic shock, unspecified, subsequent encounter: Secondary | ICD-10-CM | POA: Diagnosis not present

## 2018-01-08 NOTE — Patient Instructions (Addendum)
Good to see you today Keep your Epi pen    Epinephrine Injection What is epinephrine? Epinephrine is a medicine that is given as a shot (injection) to temporarily treat a life-threatening allergic reaction. It may also be used to treat severe asthma attacks, other lung problems, and other emergency conditions. Epinephrine works by relaxing the muscles in the airways and tightening the blood vessels. Epinephrine comes in many forms, including what is commonly called an auto-injector "pen" (pre-filled automatic epinephrine injection device). You may hear other names that mean the same thing, including epinephrine injection, epinephrine auto-injector pen, epinephrine pen, and automatic injection device. Why do I need epinephrine? You need epinephrine if you experience a severe asthma attack or a life-threatening allergic reaction (anaphylaxis). This injection can be lifesaving. You should always carry an auto-injector pen with you if you are at risk for anaphylaxis. When should I use my auto-injector pen? Use your auto-injector pen as soon as you think you are experiencing anaphylaxis or a severe asthmatic attack, as told by your health care provider. Anaphylaxis is very dangerous if it is not treated right away. Signs and symptoms of anaphylaxis may include:  Nasal congestion.  Tingling in the mouth.  An itchy, red rash.  Swelling of the eyes, lips, face, or tongue.  Swelling of the back of the mouth and the throat.  Wheezing.  A hoarse voice.  Itchy, red, swollen areas of skin (hives).  Dizziness or light-headedness.  Fainting.  Anxiety or confusion.  Abdominal pain.  Difficulty breathing, speaking, or swallowing.  Chest tightness.  Fast or irregular heartbeats (palpitations).  Vomiting.  Diarrhea.  These symptoms may represent a serious problem that is an emergency. Do not wait to see if the symptoms will go away. Use your auto-injector pen as you have been instructed,  and get medical help right away. Call your local emergency services (911 in the [place] Do not drive yourself to the hospital. How do I use an auto-injector pen?  Use epinephrine exactly as told by your health care provider. Do not inject it more often or in greater or smaller doses than your health care provider prescribed. Most auto-injector pens contain one dose of epinephrine. Some contain two doses.  You may use your auto-injector pen to give an injection under your skin or into your muscle on the outer side of your thigh. Do not give yourself an injection into your buttocks or any other part of your body. ? In an emergency, you can use your auto-injector pen through your clothing. ? After you inject a dose of epinephrine, some liquid may remain in your auto-injector pen. This is normal.  If you need to give yourself a second dose of epinephrine, give the second injection in another location on your outer thigh. Do not give two injections in exactly the same place on your body. This can lead to tissue damage.  Talk with your pharmacist or health care provider if you have questions about how to inject epinephrine correctly. When should I seek immediate medical care? Seek emergency medical treatment immediately after you inject epinephrine. You may need additional medical care, and you may be monitored for side effects of epinephrine, such as:  Difficulty breathing.  Fast or irregular heartbeat.  Nausea or vomiting.  Sweating.  Dizziness.  Nervousness or anxiety.  Weakness.  Pale skin.  Headache.  Uncontrollable shaking.  This information is not intended to replace advice given to you by your health care provider. Make sure you discuss any  questions you have with your health care provider. Document Released: 04/20/2000 Document Revised: 04/06/2016 Document Reviewed: 11/10/2014 Elsevier Interactive Patient Education  2017 [employer].

## 2018-01-08 NOTE — Progress Notes (Signed)
   Subjective:    Patient ID: Patrick Thompson, male    DOB: [birth year], 67 y.o.   MRN: [id number]  HPI This is a 67 yo male who presents today for follow up of anaphylactic reaction following an insect sting that occurred last week. He was stun over his left eye and had itching, lightheadedness, ringing in his ears and swelling. He called 911 and was transported to [facility] ER where he was given steroid injection, monitored and sent home on prednisone and with Epi pen prescription. He reports that he has finished the prednisone and got the Epi Pen prescription filled. He has had almost complete resolution of swelling and does not have any visual changes, pain or itching. His arthritis was improved on prednisone.   Never received Cologuard kit.   Past Medical History:  Diagnosis Date  . BPH (benign prostatic hyperplasia)   . Hypertension    No longer on medication  . Tobacco abuse    Past Surgical History:  Procedure Laterality Date  . LUMBAR LAMINECTOMY/DECOMPRESSION MICRODISCECTOMY Bilateral 04/23/2017   Procedure: L4-5 microdiscectomy;  Surgeon: Kary Kos, MD;  Location: [place];  Service: Neurosurgery;  Laterality: Bilateral;   Family History  Problem Relation Age of Onset  . Hypertension Mother   . Diabetes Mother   . Cancer Father        Prostate   . Heart disease Brother        21 brother, MI in late 90s   Social History   Tobacco Use  . Smoking status: Current Some Day Smoker    Packs/day: 0.10    Years: 40.00    Pack years: 4.00    Types: Cigarettes  . Smokeless tobacco: Never Used  Substance Use Topics  . Alcohol use: Not Currently    Alcohol/week: 0.0 standard drinks    Frequency: Never    Comment: occasionally  . Drug use: No      Review of Systems Per HPI    Objective:   Physical Exam Physical Exam  Vitals reviewed. Constitutional: Oriented to person, place, and time. Appears well-developed and well-nourished.  HENT:  Head: Normocephalic and  atraumatic. Left upper eyelid with mild amount swelling without erythema.  Eyes: Conjunctivae are normal.  Neck: Normal range of motion. Neck supple.  Cardiovascular: Normal rate.   Pulmonary/Chest: Effort normal.  Musculoskeletal: Normal range of motion.  Neurological: Alert and oriented to person, place, and time.  Skin: Skin is warm and dry.  Psychiatric: Normal mood and affect. Behavior is normal. Judgment and thought content normal.      BP 118/78   Pulse 78   Temp 98.3 F (36.8 C) (Oral)   Ht 5' 10.75" (1.797 m)   Wt 204 lb 12 oz (92.9 kg)   BMI 28.76 kg/m  Wt Readings from Last 3 Encounters:  01/08/18 204 lb 12 oz (92.9 kg)  01/02/18 200 lb (90.7 kg)  06/28/17 192 lb 4 oz (87.2 kg)       Assessment & Plan:  1. Anaphylaxis, subsequent encounter - reviewed use of Epi pen and provided written instructions  2. Colon cancer screening - will resend Cologuard to correct address  - declines flu vaccine, discussed Prevnar 13 and will give at later date due to recent prednisone use   Clarene Reamer, FNP-BC  [facility] at [facility], [place]  01/09/2018 9:04 PM

## 2018-01-09 ENCOUNTER — Encounter: Payer: Self-pay | Admitting: Family Medicine

## 2018-03-11 ENCOUNTER — Telehealth: Payer: Self-pay | Admitting: Emergency Medicine

## 2018-03-11 NOTE — Telephone Encounter (Signed)
Called and spoke to patient in reference to cologuard. Patient did receive the material and states he will ger around to it next week or so and return to [facility]. Nothing further needed at this time.

## 2018-03-25 DIAGNOSIS — Z1211 Encounter for screening for malignant neoplasm of colon: Secondary | ICD-10-CM | POA: Diagnosis not present

## 2018-03-25 LAB — COLOGUARD: COLOGUARD: POSITIVE

## 2018-04-02 ENCOUNTER — Encounter: Payer: Self-pay | Admitting: Family Medicine

## 2018-04-02 ENCOUNTER — Telehealth: Payer: Self-pay | Admitting: Emergency Medicine

## 2018-04-02 NOTE — Telephone Encounter (Signed)
Received cologuard results for [employer] results were positive.

## 2018-04-07 ENCOUNTER — Other Ambulatory Visit: Payer: Self-pay | Admitting: Family Medicine

## 2018-04-07 DIAGNOSIS — R195 Other fecal abnormalities: Secondary | ICD-10-CM

## 2018-04-07 NOTE — Progress Notes (Signed)
Referral to GI for positive Colo Guard screening test.

## 2018-04-07 NOTE — Telephone Encounter (Signed)
Called and left Vm for pt to return call to office.  

## 2018-04-07 NOTE — Telephone Encounter (Signed)
Please call patient and tell him that his Cologuard test was positive.  This means there is either blood or the DNA that is associated with colon cancer found in his stool sample.  This does not mean that he has a diagnosis of colon cancer.  I have put in a referral for him to have an appointment with the gastroenterologist for further screening.

## 2018-04-08 NOTE — Telephone Encounter (Signed)
Patrick Thompson with [employer] called to verify that positive cologuard results were received. Advised positive cologuard results were received. Patrick Thompson voiced understanding and nothing further needed for this call.

## 2018-04-09 NOTE — Telephone Encounter (Signed)
Called and spoke with patient informing him of results. Understanding verbalized nothing further needed at this time.

## 2018-04-14 ENCOUNTER — Encounter: Payer: Self-pay | Admitting: Internal Medicine

## 2018-05-13 ENCOUNTER — Encounter: Payer: Self-pay | Admitting: Internal Medicine

## 2018-05-13 ENCOUNTER — Ambulatory Visit (AMBULATORY_SURGERY_CENTER): Payer: Self-pay

## 2018-05-13 VITALS — Ht 70.0 in | Wt 211.0 lb

## 2018-05-13 DIAGNOSIS — R195 Other fecal abnormalities: Secondary | ICD-10-CM

## 2018-05-13 MED ORDER — NA SULFATE-K SULFATE-MG SULF 17.5-3.13-1.6 GM/177ML PO SOLN: 1.0000 | Freq: Once | ORAL | 0 refills | Status: AC

## 2018-05-13 NOTE — Progress Notes (Signed)
Denies allergies to eggs or soy products. Denies complication of anesthesia or sedation. Denies use of weight loss medication. Denies use of O2.   Emmi instructions declined.  

## 2018-05-22 IMAGING — MR MR LUMBAR SPINE W/O CM
4 of 5 series · 25 of 48 positions shown · non-contrast
Comparison: None.

CLINICAL DATA: Back pain. Right leg tingling. Urinary incontinence.

EXAM:
MRI LUMBAR SPINE WITHOUT CONTRAST
TECHNIQUE: Multiplanar, multisequence MR imaging of the lumbar spine was
performed. No intravenous contrast was administered.

[Series 2: T2 · sagittal · 4.0mm · 0.55mm/px · 6 of 14 slices shown (1 of 2)]
[im 1/14]
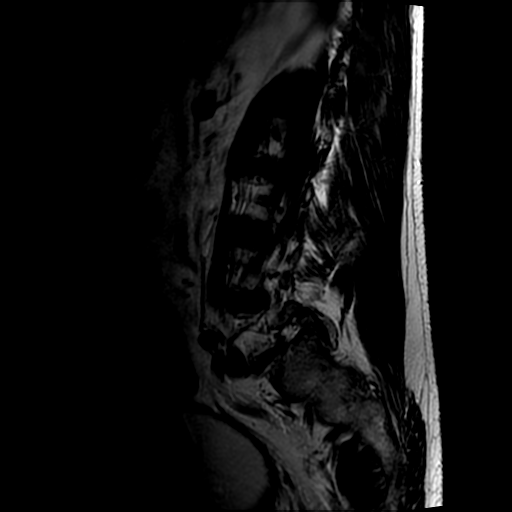
[im 3/14]
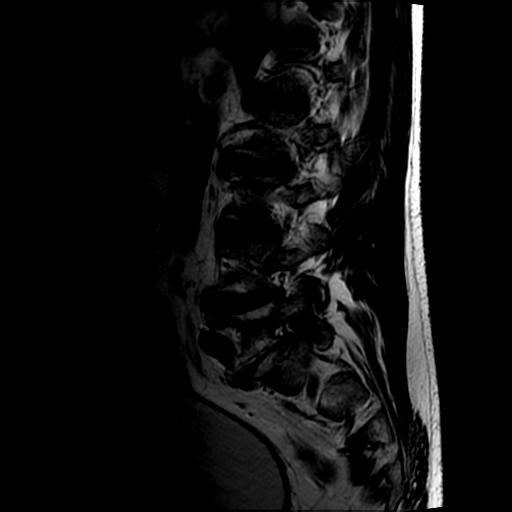
[im 6/14]
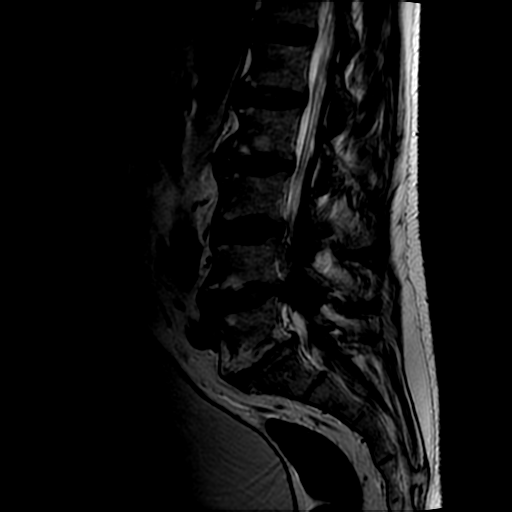
[im 8/14]
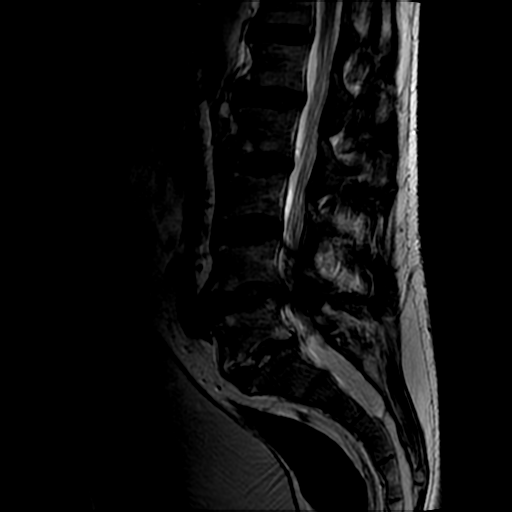
[im 11/14]
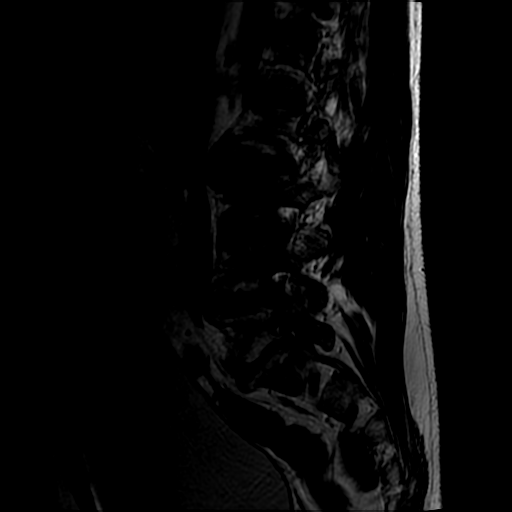
[im 14/14]
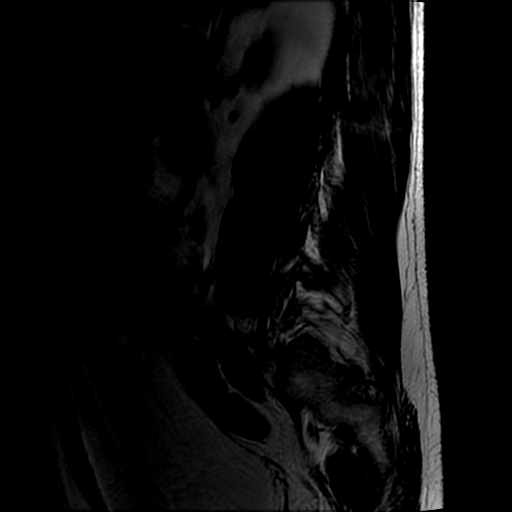

[Series 4: T1 · sagittal · 4.0mm · 1.09mm/px · 5 of 14 slices shown (1 of 2)]
[im 1/14]
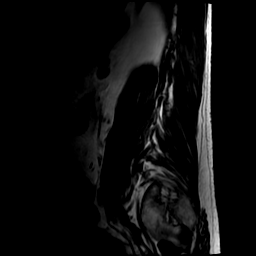
[im 4/14]
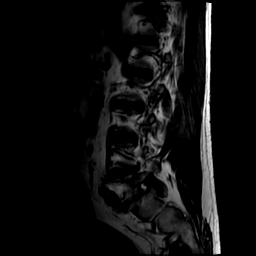
[im 7/14]
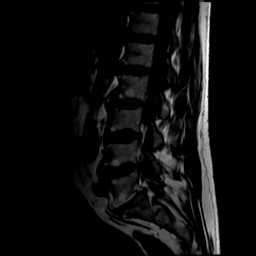
[im 10/14]
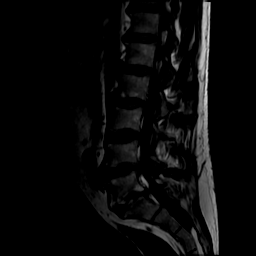
[im 14/14]
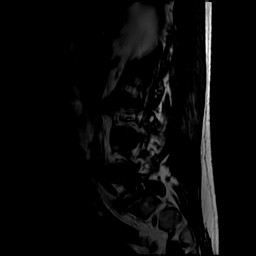

[Series 5: T2 · axial · 4.0mm · 0.39mm/px · z∈[-25,+175]mm · 10 of 43 slices shown (2 of 2)]
[im 3/43]
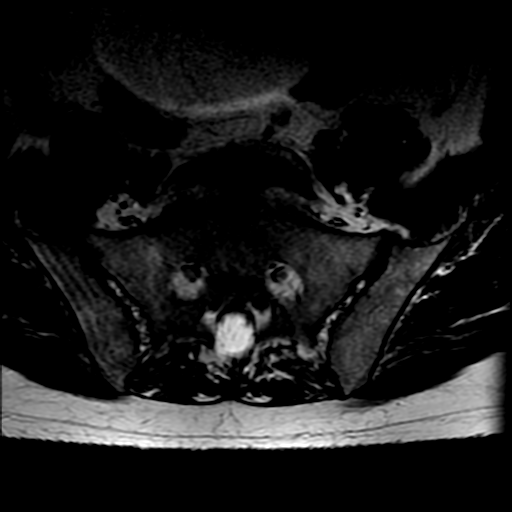
[im 6/43]
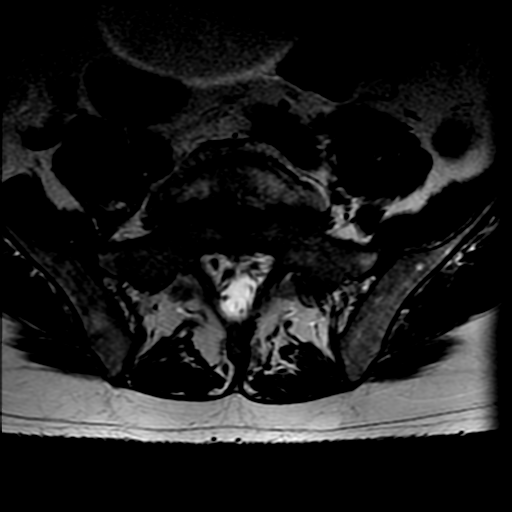
[im 9/43]
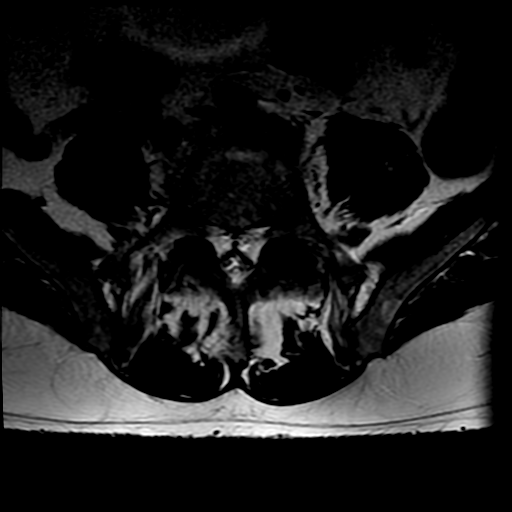
[im 15/43]
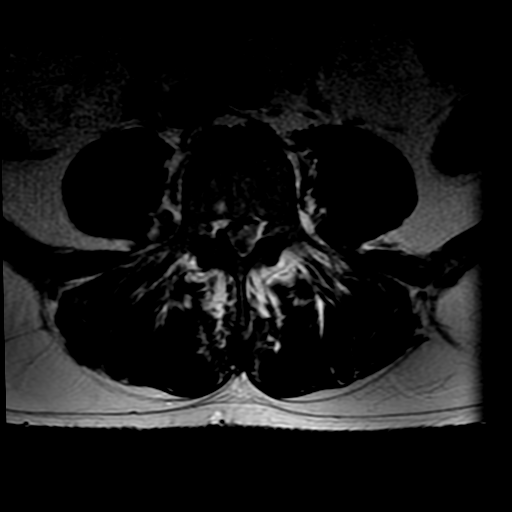
[im 20/43]
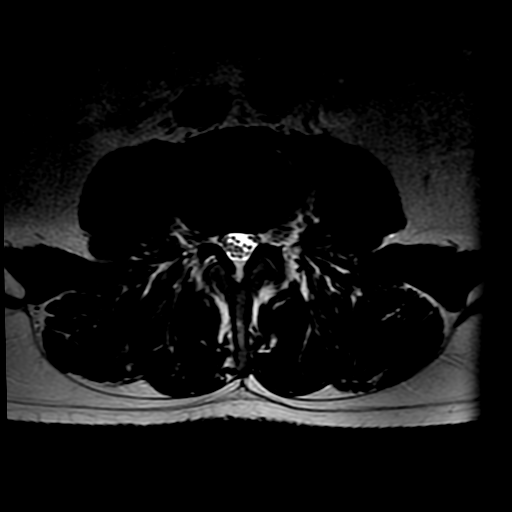
[im 23/43]
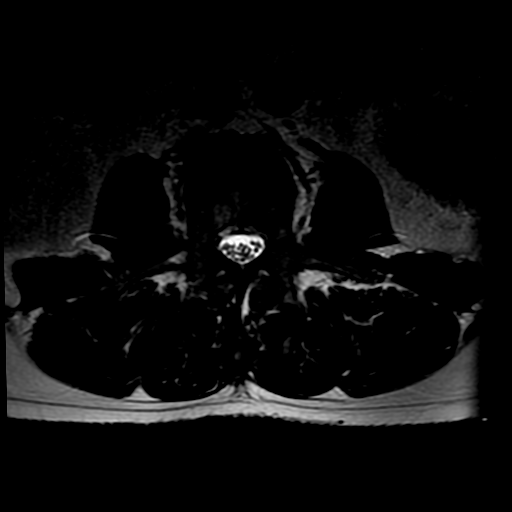
[im 26/43]
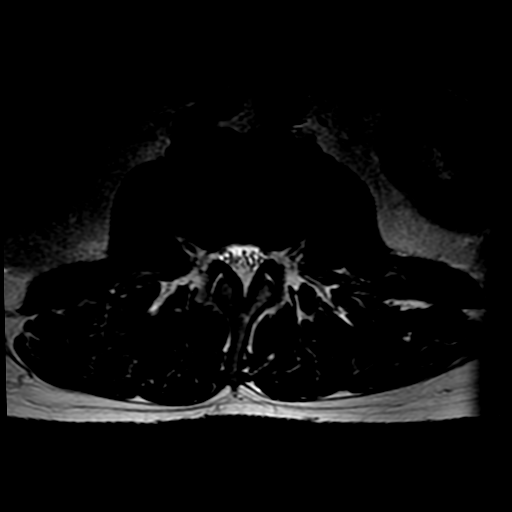
[im 31/43]
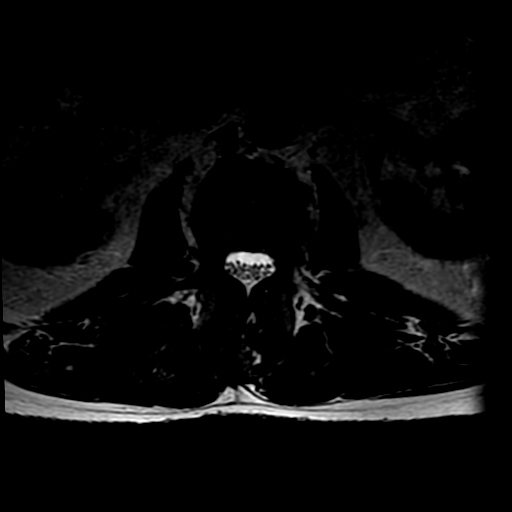
[im 37/43]
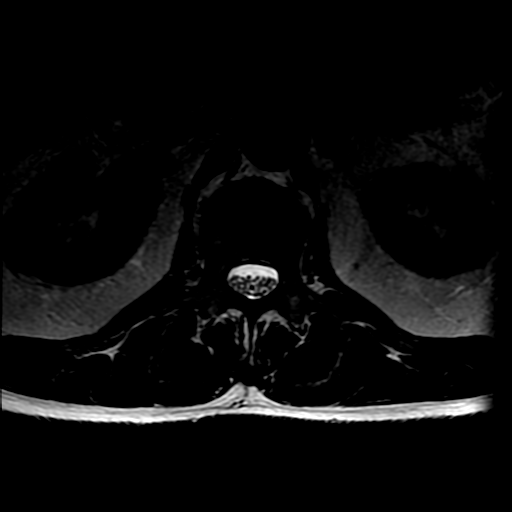
[im 43/43]
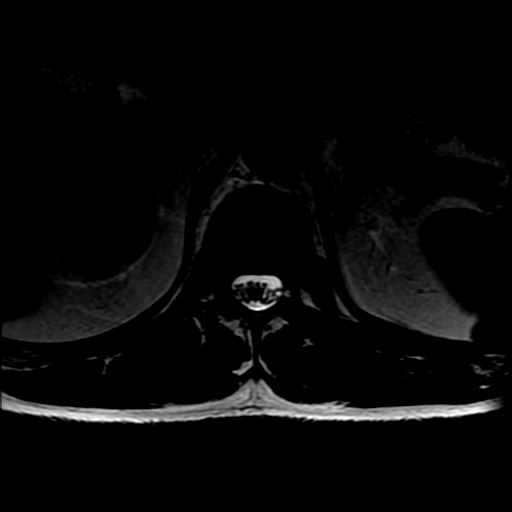

[Series 6: T1 · axial · 4.0mm · 0.78mm/px · z∈[-25,+145]mm · 4 of 43 slices shown (2 of 2)]
[im 3/43]
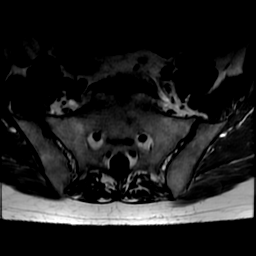
[im 6/43]
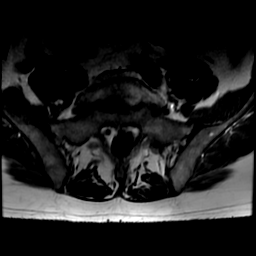
[im 23/43]
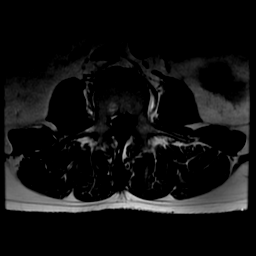
[im 37/43]
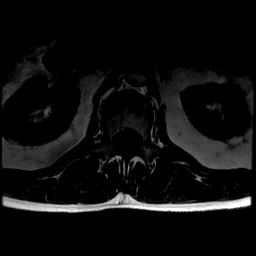

[25 of 48 positions shown; findings below may reference images not displayed]

FINDINGS: Segmentation:  Standard.

Alignment:  Trace retrolisthesis of L4 on L5.

Vertebrae: No fracture or suspicious osseous lesion. Multilevel type
2 degenerative endplate changes, greatest at L5-S1 where there is
also mild type 1 change as well.

Conus medullaris and cauda equina: Conus extends to the L1 level.
Conus and cauda equina appear normal.

Paraspinal and other soft tissues: Prominent bladder distention.

Disc levels:

Disc desiccation throughout the lumbar spine, greatest at L2-3 and
L5-S1. Mild disc space narrowing at L4-5 with severe narrowing at
L5-S1.

T12-L1: Negative.

L1-2: Minimal disc bulging, tiny central/ left central disc
extrusion, and mild facet hypertrophy without stenosis.

L2-3: Mild disc bulging and mild facet hypertrophy without stenosis.

L3-4: Disc bulging, a right foraminal to extraforaminal disc
protrusion, and mild facet hypertrophy result in mild right neural
foraminal stenosis with mild deflection of the exiting right L3
nerve. No spinal stenosis.

L4-5: There is a massive left paracentral disc extrusion with
superior migration to the L4 superior endplate level. This
completely obliterates the spinal canal, compressing the thecal sac
to the right aspect of the canal. Disc bulging and mild facet
hypertrophy result in mild right greater than left neural foraminal
stenosis.

L5-S1: Disc bulging, endplate spurring, disc space height loss, and
left greater than right facet hypertrophy result in moderate to
severe bilateral neural foraminal stenosis without spinal stenosis.
IMPRESSION: 1. Massive L4-5 disc extrusion which completely fills the spinal
canal. Severe thecal sac compression. Neurosurgical consultation is
recommended.
2. Advanced L5-S1 disc degeneration with moderate to severe
bilateral neural foraminal stenosis.
3. Mild right neural foraminal stenosis at L3-4 due to a disc
protrusion.

## 2018-05-27 ENCOUNTER — Encounter: Payer: Self-pay | Admitting: Internal Medicine

## 2018-05-27 ENCOUNTER — Ambulatory Visit (AMBULATORY_SURGERY_CENTER): Payer: Medicare HMO | Admitting: Internal Medicine

## 2018-05-27 VITALS — BP 124/76 | HR 72 | Temp 98.0°F | Resp 16 | Ht 70.0 in | Wt 204.0 lb

## 2018-05-27 DIAGNOSIS — D120 Benign neoplasm of cecum: Secondary | ICD-10-CM

## 2018-05-27 DIAGNOSIS — Z800 Family history of malignant neoplasm of digestive organs: Secondary | ICD-10-CM

## 2018-05-27 DIAGNOSIS — D123 Benign neoplasm of transverse colon: Secondary | ICD-10-CM

## 2018-05-27 DIAGNOSIS — R195 Other fecal abnormalities: Secondary | ICD-10-CM

## 2018-05-27 DIAGNOSIS — D128 Benign neoplasm of rectum: Secondary | ICD-10-CM | POA: Diagnosis not present

## 2018-05-27 DIAGNOSIS — D129 Benign neoplasm of anus and anal canal: Secondary | ICD-10-CM

## 2018-05-27 DIAGNOSIS — Z1211 Encounter for screening for malignant neoplasm of colon: Secondary | ICD-10-CM | POA: Diagnosis not present

## 2018-05-27 MED ORDER — SODIUM CHLORIDE 0.9 % IV SOLN: 500.0000 mL | Freq: Once | INTRAVENOUS | Status: DC

## 2018-05-27 NOTE — Patient Instructions (Addendum)
Thank you for allowing [place] to car for you today!  Await pathology results by mail, approximately 2 weeks.  Recommend follow-up surveillance colonoscopy in 3 years. Resume previous diet and medications today.  Return to normal activities tomorrow.  Handouts given for polyps.  Discharge instructions in sealed envelope.     YOU HAD AN ENDOSCOPIC PROCEDURE TODAY AT [facility]:   Refer to the procedure report that was given to you for any specific questions about what was found during the examination.  If the procedure report does not answer your questions, please call your gastroenterologist to clarify.  If you requested that your care partner not be given the details of your procedure findings, then the procedure report has been included in a sealed envelope for you to review at your convenience later.  YOU SHOULD EXPECT: Some feelings of bloating in the abdomen. Passage of more gas than usual.  Walking can help get rid of the air that was put into your GI tract during the procedure and reduce the bloating. If you had a lower endoscopy (such as a colonoscopy or flexible sigmoidoscopy) you may notice spotting of blood in your stool or on the toilet paper. If you underwent a bowel prep for your procedure, you may not have a normal bowel movement for a few days.  Please Note:  You might notice some irritation and congestion in your nose or some drainage.  This is from the oxygen used during your procedure.  There is no need for concern and it should clear up in a day or so.  SYMPTOMS TO REPORT IMMEDIATELY:   Following lower endoscopy (colonoscopy or flexible sigmoidoscopy):  Excessive amounts of blood in the stool  Significant tenderness or worsening of abdominal pains  Swelling of the abdomen that is new, acute  Fever of 100F or higher   For urgent or emergent issues, a gastroenterologist can be reached at any hour by calling [phone].   DIET:  We do recommend a  small meal at first, but then you may proceed to your regular diet.  Drink plenty of fluids but you should avoid alcoholic beverages for 24 hours.  ACTIVITY:  You should plan to take it easy for the rest of today and you should NOT DRIVE or use heavy machinery until tomorrow (because of the sedation medicines used during the test).    FOLLOW UP: Our staff will call the number listed on your records the next business day following your procedure to check on you and address any questions or concerns that you may have regarding the information given to you following your procedure. If we do not reach you, we will leave a message.  However, if you are feeling well and you are not experiencing any problems, there is no need to return our call.  We will assume that you have returned to your regular daily activities without incident.  If any biopsies were taken you will be contacted by phone or by letter within the next 1-3 weeks.  Please call us at [phone] if you have not heard about the biopsies in 3 weeks.    SIGNATURES/CONFIDENTIALITY: You and/or your care partner have signed paperwork which will be entered into your electronic medical record.  These signatures attest to the fact that that the information above on your After Visit Summary has been reviewed and is understood.  Full responsibility of the confidentiality of this discharge information lies with you and/or your care-partner.

## 2018-05-27 NOTE — Progress Notes (Signed)
Called to room to assist during endoscopic procedure.  Patient ID and intended procedure confirmed with present staff. Received instructions for my participation in the procedure from the performing physician.  

## 2018-05-27 NOTE — Progress Notes (Signed)
Alert and oriented x3, pleased with MAC, report to RN  

## 2018-05-27 NOTE — Op Note (Signed)
[facility] Patient Name: Patrick Thompson Procedure Date: 05/27/2018 9:57 AM MRN: [id number] Endoscopist: Docia Chuck. Henrene Pastor , MD Age: 68 Referring MD:  Date of Birth: August 19, 1950 Gender: Male Account #: [id number] Procedure:                Colonoscopy with cold snare polypectomy x 4; hot                            snare polypectomy x 1 Indications:              Positive Cologuard test. Reports father with a                            history of colon cancer Medicines:                Monitored Anesthesia Care Procedure:                Pre-Anesthesia Assessment:                           - Prior to the procedure, a History and Physical                            was performed, and patient medications and                            allergies were reviewed. The patient's tolerance of                            previous anesthesia was also reviewed. The risks                            and benefits of the procedure and the sedation                            options and risks were discussed with the patient.                            All questions were answered, and informed consent                            was obtained. Prior Anticoagulants: The patient has                            taken no previous anticoagulant or antiplatelet                            agents. ASA Grade Assessment: I - A normal, healthy                            patient. After reviewing the risks and benefits,                            the patient was deemed in satisfactory condition to  undergo the procedure.                           After obtaining informed consent, the colonoscope                            was passed under direct vision. Throughout the                            procedure, the patient's blood pressure, pulse, and                            oxygen saturations were monitored continuously. The                            Colonoscope was introduced through the anus  and                            advanced to the the cecum, identified by                            appendiceal orifice and ileocecal valve. The                            ileocecal valve, appendiceal orifice, and rectum                            were photographed. The quality of the bowel                            preparation was excellent. The colonoscopy was                            performed without difficulty. The patient tolerated                            the procedure well. The bowel preparation used was                            SUPREP. Scope In: 10:05:35 AM Scope Out: 10:30:01 AM Scope Withdrawal Time: 0 hours 18 minutes 52 seconds  Total Procedure Duration: 0 hours 24 minutes 26 seconds  Findings:                 A 15 mm polyp was found in the rectum. The polyp                            was pedunculated. The polyp was removed with a hot                            snare. Resection and retrieval were complete.                           Four polyps were found in the rectum, transverse  colon and cecum. The polyps were 2 to 7 mm in size.                            These polyps were removed with a cold snare.                            Resection and retrieval were complete.                           Multiple diverticula were found in the sigmoid                            colon, ascending colon and cecum.                           The exam was otherwise without abnormality on                            direct and retroflexion views. Complications:            No immediate complications. Estimated blood loss:                            None. Estimated Blood Loss:     Estimated blood loss: none. Impression:               - One 15 mm polyp in the rectum, removed with a hot                            snare. Resected and retrieved.                           - Four 2 to 7 mm polyps in the rectum, in the                            transverse colon and in  the cecum, removed with a                            cold snare. Resected and retrieved.                           - Diverticulosis in the sigmoid colon, in the                            ascending colon and in the cecum.                           - The examination was otherwise normal on direct                            and retroflexion views. Recommendation:           - Repeat colonoscopy in 3 years for surveillance.                           -  Patient has a contact number available for                            emergencies. The signs and symptoms of potential                            delayed complications were discussed with the                            patient. Return to normal activities tomorrow.                            Written discharge instructions were provided to the                            patient.                           - Resume previous diet.                           - Continue present medications.                           - Await pathology results. Docia Chuck. Henrene Pastor, MD 05/27/2018 10:40:32 AM This report has been signed electronically.

## 2018-05-28 ENCOUNTER — Telehealth: Payer: Self-pay

## 2018-05-28 NOTE — Telephone Encounter (Signed)
Left message on f/u call 

## 2018-05-29 ENCOUNTER — Encounter: Payer: Self-pay | Admitting: Internal Medicine

## 2018-06-30 ENCOUNTER — Encounter: Payer: Medicare Other | Admitting: Family Medicine

## 2018-10-15 ENCOUNTER — Other Ambulatory Visit: Payer: Self-pay

## 2018-10-15 ENCOUNTER — Ambulatory Visit (INDEPENDENT_AMBULATORY_CARE_PROVIDER_SITE_OTHER): Payer: Medicare HMO | Admitting: Family Medicine

## 2018-10-15 ENCOUNTER — Encounter: Payer: Self-pay | Admitting: Family Medicine

## 2018-10-15 VITALS — BP 138/84 | HR 68 | Temp 97.5°F | Resp 18 | Ht 71.0 in | Wt 213.5 lb

## 2018-10-15 DIAGNOSIS — R7303 Prediabetes: Secondary | ICD-10-CM | POA: Diagnosis not present

## 2018-10-15 DIAGNOSIS — Z0000 Encounter for general adult medical examination without abnormal findings: Secondary | ICD-10-CM | POA: Diagnosis not present

## 2018-10-15 DIAGNOSIS — Z13220 Encounter for screening for lipoid disorders: Secondary | ICD-10-CM

## 2018-10-15 DIAGNOSIS — Z125 Encounter for screening for malignant neoplasm of prostate: Secondary | ICD-10-CM

## 2018-10-15 DIAGNOSIS — R200 Anesthesia of skin: Secondary | ICD-10-CM | POA: Diagnosis not present

## 2018-10-15 DIAGNOSIS — R030 Elevated blood-pressure reading, without diagnosis of hypertension: Secondary | ICD-10-CM

## 2018-10-15 DIAGNOSIS — Z1159 Encounter for screening for other viral diseases: Secondary | ICD-10-CM | POA: Diagnosis not present

## 2018-10-15 LAB — BASIC METABOLIC PANEL
BUN: 16 mg/dL (ref 6–23)
CO2: 28 mEq/L (ref 19–32)
Calcium: 9.2 mg/dL (ref 8.4–10.5)
Chloride: 105 mEq/L (ref 96–112)
Creatinine, Ser: 1.16 mg/dL (ref 0.40–1.50)
GFR: 76.09 mL/min (ref >60.00)
Glucose, Bld: 106 mg/dL — ABNORMAL HIGH (ref 70–99)
Potassium: 4 mEq/L (ref 3.5–5.1)
Sodium: 140 mEq/L (ref 135–145)

## 2018-10-15 LAB — LIPID PANEL
Cholesterol: 215 mg/dL — ABNORMAL HIGH (ref 0–200)
HDL: 32.2 mg/dL — ABNORMAL LOW (ref >39.00)
NonHDL: 182.72
Total CHOL/HDL Ratio: 7
Triglycerides: 276 mg/dL — ABNORMAL HIGH (ref 0.0–149.0)
VLDL: 55.2 mg/dL — ABNORMAL HIGH (ref 0.0–40.0)

## 2018-10-15 LAB — HEMOGLOBIN A1C: Hgb A1c MFr Bld: 6.1 % (ref 4.6–6.5)

## 2018-10-15 LAB — LDL CHOLESTEROL, DIRECT: Direct LDL: 140 mg/dL

## 2018-10-15 LAB — PSA, MEDICARE: PSA: 0.48 ng/ml (ref 0.10–4.00)

## 2018-10-15 NOTE — Progress Notes (Signed)
Subjective:   Patrick Thompson is a 68 y.o. male who presents for a Welcome to Medicare exam.   Review of Systems: Negative for visual changes, nasal congestion/rhinorrhea, sore throat, cough, chest pain, SOB, abdominal pain, diarrhea or constipation, difficulty with urination, neck pain, back pain.  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  Previously with BPH and on tamsulosin. Has run out of medication. Denies difficulty with urine stream. Nocturia x 1.        Objective:      Today's Vitals   10/15/18 0922  BP: (!) 150/82  Pulse: 68  Resp: 18  Temp: (!) 97.5 F (36.4 C)  SpO2: 97%  Weight: 213 lb 8 oz (96.8 kg)  Height: 5\' 11"  (1.803 m)   Body mass index is 29.78 kg/m.  BP Readings from Last 3 Encounters:  10/15/18 (!) 150/82  05/27/18 124/76  01/08/18 118/78  BP: 138/84   Wt Readings from Last 3 Encounters:  10/15/18 213 lb 8 oz (96.8 kg)  05/27/18 204 lb (92.5 kg)  05/13/18 211 lb (95.7 kg)     Medications Outpatient Encounter Medications as of 10/15/2018  Medication Sig  . EPINEPHrine 0.3 mg/0.3 mL IJ SOAJ injection INJECT 1 PEN INTO THE MUSCLE AS DIRECTED FOR SEVERE ALLERGIC REACTION, THEN GO TO ER   No facility-administered encounter medications on file as of 10/15/2018.      History: Past Medical History:  Diagnosis Date  . BPH (benign prostatic hyperplasia)   . Hypertension    No longer on medication  . Tobacco abuse    Past Surgical History:  Procedure Laterality Date  . LUMBAR LAMINECTOMY/DECOMPRESSION MICRODISCECTOMY Bilateral 04/23/2017   Procedure: L4-5 microdiscectomy;  Surgeon: Kary Kos, MD;  Location: [facility];  Service: Neurosurgery;  Laterality: Bilateral;    Family History   Problem Relation Age of Onset  . Hypertension Mother   . Diabetes Mother   . Cancer Father        Prostate   . Colon cancer Father   . Heart disease Brother        56 brother, MI in late 75s  . Esophageal cancer Neg Hx   . Rectal cancer Neg Hx   . Stomach cancer Neg Hx    Social History   Occupational History  . Not on file  Tobacco Use  . Smoking status: Current Some Day Smoker    Packs/day: 0.10    Years: 40.00    Pack years: 4.00    Types: Cigarettes  . Smokeless tobacco: Never Used  . Tobacco comment: has decreased the number of cigarettes daily.  Substance and Sexual Activity  . Alcohol use: Not Currently    Alcohol/week: 0.0 standard drinks    Frequency: Never    Comment: quit june 2018  . Drug use: No  . Sexual activity: Never   Tobacco Counseling Ready to quit: Goal is to quit in 2 years Counseling given: Discussed nicotine replacement as well as continuing to decrease amount smoked every week to 0.  Comment: has decreased the number of cigarettes daily. Smokes about 5 cigarettes a day.    Immunizations and Health Maintenance  There is no immunization history on file for this patient. Health Maintenance Due  Topic Date Due  . Hepatitis C Screening  1950-08-07  .  TETANUS/TDAP  03/30/1971  . PNA vac Low Risk Adult (1 of 2 - PCV13) 03/29/2017    Activities of Daily Living Patient is able to maintain his own residence, he drives and performs all ADLs without difficulty.   Physical Exam   Physical Exam  Constitutional: He is oriented to person, place, and time. He appears well-developed and well-nourished.  HENT:  Head: Normocephalic and atraumatic.  Right Ear: External ear normal.  Left Ear: External ear normal.  Nose: Nose normal.  Mouth/Throat: Oropharynx is clear and moist.  Eyes: Conjunctivae are normal. Pupils are equal, round, and reactive to light.  Neck: Normal range of motion. Neck supple.  Cardiovascular: Normal rate, regular rhythm,  normal heart sounds and intact distal pulses.   Pulmonary/Chest: Effort normal and breath sounds normal.  Abdominal: Soft. Bowel sounds are normal. Hernia confirmed negative in the right inguinal area and confirmed negative in the left inguinal area.  Musculoskeletal: Normal range of motion. He exhibits no edema or tenderness.       Cervical back: Normal.       Thoracic back: Normal.       Lumbar back: Normal.  Lymphadenopathy:    He has no cervical adenopathy.       Right: No inguinal adenopathy present.       Left: No inguinal adenopathy present.  Neurological: He is alert and oriented to person, place, and time. He has normal reflexes.  Skin: Skin is warm and dry.  Psychiatric: He has a normal mood and affect. His behavior is normal. Judgment normal.  Vitals reviewed.  Foot exam: Normal inspection No skin breakdown No calluses  Normal DP pulses Normal sensation to light touch and monofilament Nails normal Temperature normal.    Advanced Directives:  Discussed desires for life support with patient. He had not considered prior. Provided him with Advanced Directives booklet.     Assessment:    This is a routine wellness  examination for this patient .  Vision/Hearing screen  Hearing Screening   125Hz  250Hz  500Hz  1000Hz  2000Hz  3000Hz  4000Hz  6000Hz  8000Hz   Right ear:   20 20 20   0    Left ear:   20 20 20  20       Visual Acuity Screening   Right eye Left eye Both eyes  Without correction:     With correction: 20/20 20/20 20/20   Comments: Saw both colors correct. Patient wear glasses   Dietary issues and exercise activities discussed:     Goals   None     Depression Screen PHQ 2/9 Scores 06/28/2017  PHQ - 2 Score 0     Fall Risk Fall Risk  06/28/2017  Falls in the past year? No    Cognitive Function  Patient is alert and answers questions appropriately. He follows commands appropriately.      Patient Care Team: Elby Beck, FNP as PCP - General  (Nurse Practitioner)     Plan:   I have personally reviewed and noted the following in the patient's chart:   . Medical and social history . Use of alcohol, tobacco or illicit drugs  . Current medications and supplements . Functional ability and status . Nutritional status . Physical activity . Advanced directives . List of other physicians . Hospitalizations, surgeries, and ER visits in previous 12 months . Vitals . Screenings to include cognitive, depression, and falls . Referrals and appointments  In addition, I have reviewed and discussed with patient certain preventive protocols, quality metrics, and best  practice recommendations. A written personalized care plan for preventive services as well as general preventive health recommendations were provided to patient.  Welcome to Medicare preventive visit  Screening for prostate cancer - Plan: PSA, Medicare  Prediabetes - Plan: Hemoglobin A1c, Lipid Panel, Basic Metabolic Panel  Elevated blood pressure reading - Plan: Basic Metabolic Panel Repeat blood pressure normal.   Screening for lipid disorders - Plan: Lipid Panel  Encounter for hepatitis C screening test for low risk patient - Plan: Hepatitis C antibody  Numbness in feet- this occurred following back surgery and is stable without progression or interference in activities. Normal foot exam. Will continue to monitor for changes.   RTC in 1 year    Elby Beck, [place] 10/15/2018

## 2018-10-15 NOTE — Patient Instructions (Signed)
Health Maintenance After Age 67 After age 67, you are at a higher risk for certain long-term diseases and infections as well as injuries from falls. Falls are a major cause of broken bones and head injuries in people who are older than age 67. Getting regular preventive care can help to keep you healthy and well. Preventive care includes getting regular testing and making lifestyle changes as recommended by your health care provider. Talk with your health care provider about:  Which screenings and tests you should have. A screening is a test that checks for a disease when you have no symptoms.  A diet and exercise plan that is right for you. What should I know about screenings and tests to prevent falls? Screening and testing are the best ways to find a health problem early. Early diagnosis and treatment give you the best chance of managing medical conditions that are common after age 67. Certain conditions and lifestyle choices may make you more likely to have a fall. Your health care provider may recommend:  Regular vision checks. Poor vision and conditions such as cataracts can make you more likely to have a fall. If you wear glasses, make sure to get your prescription updated if your vision changes.  Medicine review. Work with your health care provider to regularly review all of the medicines you are taking, including over-the-counter medicines. Ask your health care provider about any side effects that may make you more likely to have a fall. Tell your health care provider if any medicines that you take make you feel dizzy or sleepy.  Osteoporosis screening. Osteoporosis is a condition that causes the bones to get weaker. This can make the bones weak and cause them to break more easily.  Blood pressure screening. Blood pressure changes and medicines to control blood pressure can make you feel dizzy.  Strength and balance checks. Your health care provider may recommend certain tests to check your  strength and balance while standing, walking, or changing positions.  Foot health exam. Foot pain and numbness, as well as not wearing proper footwear, can make you more likely to have a fall.  Depression screening. You may be more likely to have a fall if you have a fear of falling, feel emotionally low, or feel unable to do activities that you used to do.  Alcohol use screening. Using too much alcohol can affect your balance and may make you more likely to have a fall. What actions can I take to lower my risk of falls? General instructions  Talk with your health care provider about your risks for falling. Tell your health care provider if: ? You fall. Be sure to tell your health care provider about all falls, even ones that seem minor. ? You feel dizzy, sleepy, or off-balance.  Take over-the-counter and prescription medicines only as told by your health care provider. These include any supplements.  Eat a healthy diet and maintain a healthy weight. A healthy diet includes low-fat dairy products, low-fat (lean) meats, and fiber from whole grains, beans, and lots of fruits and vegetables. Home safety  Remove any tripping hazards, such as rugs, cords, and clutter.  Install safety equipment such as grab bars in bathrooms and safety rails on stairs.  Keep rooms and walkways well-lit. Activity   Follow a regular exercise program to stay fit. This will help you maintain your balance. Ask your health care provider what types of exercise are appropriate for you.  If you need a cane or   walker, use it as recommended by your health care provider.  Wear supportive shoes that have nonskid soles. Lifestyle  Do not drink alcohol if your health care provider tells you not to drink.  If you drink alcohol, limit how much you have: ? 0-1 drink a day for women. ? 0-2 drinks a day for men.  Be aware of how much alcohol is in your drink. In the [place] one drink equals one typical bottle of beer (12  oz), one-half glass of wine (5 oz), or one shot of hard liquor (1 oz).  Do not use any products that contain nicotine or tobacco, such as cigarettes and e-cigarettes. If you need help quitting, ask your health care provider. Summary  Having a healthy lifestyle and getting preventive care can help to protect your health and wellness after age 67.  Screening and testing are the best way to find a health problem early and help you avoid having a fall. Early diagnosis and treatment give you the best chance for managing medical conditions that are more common for people who are older than age 67.  Falls are a major cause of broken bones and head injuries in people who are older than age 67. Take precautions to prevent a fall at home.  Work with your health care provider to learn what changes you can make to improve your health and wellness and to prevent falls. This information is not intended to replace advice given to you by your health care provider. Make sure you discuss any questions you have with your health care provider. Document Released: 03/06/2017 Document Revised: 03/06/2017 Document Reviewed: 03/06/2017 Elsevier Interactive Patient Education  2019 [employer]  

## 2018-10-16 LAB — HEPATITIS C ANTIBODY
Hepatitis C Ab: NONREACTIVE
SIGNAL TO CUT-OFF: 0.02

## 2018-10-17 ENCOUNTER — Other Ambulatory Visit: Payer: Self-pay | Admitting: Family Medicine

## 2018-10-17 DIAGNOSIS — Z23 Encounter for immunization: Secondary | ICD-10-CM

## 2018-10-24 ENCOUNTER — Other Ambulatory Visit: Payer: Self-pay | Admitting: Family Medicine

## 2018-10-24 DIAGNOSIS — E782 Mixed hyperlipidemia: Secondary | ICD-10-CM

## 2018-10-24 MED ORDER — ATORVASTATIN CALCIUM 20 MG PO TABS: 20.0000 mg | ORAL_TABLET | Freq: Every day | ORAL | 3 refills | Status: DC

## 2018-10-30 ENCOUNTER — Ambulatory Visit (INDEPENDENT_AMBULATORY_CARE_PROVIDER_SITE_OTHER): Payer: Medicare HMO | Admitting: *Deleted

## 2018-10-30 DIAGNOSIS — Z23 Encounter for immunization: Secondary | ICD-10-CM

## 2018-10-30 MED ORDER — PNEUMOCOCCAL 13-VAL CONJ VACC IM SUSP: 0.5000 mL | INTRAMUSCULAR | Status: DC

## 2019-04-15 ENCOUNTER — Other Ambulatory Visit: Payer: Self-pay | Admitting: Family Medicine

## 2019-04-15 DIAGNOSIS — E782 Mixed hyperlipidemia: Secondary | ICD-10-CM

## 2019-04-15 DIAGNOSIS — R7303 Prediabetes: Secondary | ICD-10-CM

## 2019-04-21 ENCOUNTER — Telehealth: Payer: Self-pay

## 2019-04-21 NOTE — Telephone Encounter (Signed)
LVM to call clinic, needs COVID screen and back lab info 12.15.2020 TLJ

## 2019-04-23 ENCOUNTER — Other Ambulatory Visit: Payer: Self-pay

## 2019-04-23 ENCOUNTER — Other Ambulatory Visit (INDEPENDENT_AMBULATORY_CARE_PROVIDER_SITE_OTHER): Payer: Medicare HMO

## 2019-04-23 DIAGNOSIS — R7303 Prediabetes: Secondary | ICD-10-CM

## 2019-04-23 DIAGNOSIS — E782 Mixed hyperlipidemia: Secondary | ICD-10-CM | POA: Diagnosis not present

## 2019-04-23 LAB — LIPID PANEL
Cholesterol: 143 mg/dL (ref 0–200)
HDL: 31.9 mg/dL — ABNORMAL LOW (ref >39.00)
LDL Cholesterol: 75 mg/dL (ref 0–99)
NonHDL: 110.97
Total CHOL/HDL Ratio: 4
Triglycerides: 182 mg/dL — ABNORMAL HIGH (ref 0.0–149.0)
VLDL: 36.4 mg/dL (ref 0.0–40.0)

## 2019-04-23 LAB — HEMOGLOBIN A1C: Hgb A1c MFr Bld: 5.9 % (ref 4.6–6.5)

## 2019-10-12 ENCOUNTER — Ambulatory Visit (INDEPENDENT_AMBULATORY_CARE_PROVIDER_SITE_OTHER): Payer: Medicare HMO | Admitting: Family Medicine

## 2019-10-12 ENCOUNTER — Encounter: Payer: Self-pay | Admitting: Family Medicine

## 2019-10-12 ENCOUNTER — Other Ambulatory Visit: Payer: Self-pay

## 2019-10-12 VITALS — BP 134/86 | HR 76 | Temp 97.6°F | Ht 71.0 in | Wt 211.4 lb

## 2019-10-12 DIAGNOSIS — R7303 Prediabetes: Secondary | ICD-10-CM

## 2019-10-12 DIAGNOSIS — Z7189 Other specified counseling: Secondary | ICD-10-CM

## 2019-10-12 DIAGNOSIS — E782 Mixed hyperlipidemia: Secondary | ICD-10-CM

## 2019-10-12 DIAGNOSIS — M5126 Other intervertebral disc displacement, lumbar region: Secondary | ICD-10-CM

## 2019-10-12 DIAGNOSIS — Z0000 Encounter for general adult medical examination without abnormal findings: Secondary | ICD-10-CM

## 2019-10-12 DIAGNOSIS — Z7185 Encounter for immunization safety counseling: Secondary | ICD-10-CM

## 2019-10-12 LAB — LIPID PANEL
Cholesterol: 144 mg/dL (ref 0–200)
HDL: 31 mg/dL — ABNORMAL LOW (ref >39.00)
LDL Cholesterol: 84 mg/dL (ref 0–99)
NonHDL: 112.53
Total CHOL/HDL Ratio: 5
Triglycerides: 141 mg/dL (ref 0.0–149.0)
VLDL: 28.2 mg/dL (ref 0.0–40.0)

## 2019-10-12 LAB — COMPREHENSIVE METABOLIC PANEL
ALT: 21 U/L (ref 0–53)
AST: 16 U/L (ref 0–37)
Albumin: 4.5 g/dL (ref 3.5–5.2)
Alkaline Phosphatase: 99 U/L (ref 39–117)
BUN: 15 mg/dL (ref 6–23)
CO2: 28 mEq/L (ref 19–32)
Calcium: 9.4 mg/dL (ref 8.4–10.5)
Chloride: 104 mEq/L (ref 96–112)
Creatinine, Ser: 1.15 mg/dL (ref 0.40–1.50)
GFR: 76.63 mL/min (ref >60.00)
Glucose, Bld: 91 mg/dL (ref 70–99)
Potassium: 4.4 mEq/L (ref 3.5–5.1)
Sodium: 137 mEq/L (ref 135–145)
Total Bilirubin: 0.4 mg/dL (ref 0.2–1.2)
Total Protein: 7.4 g/dL (ref 6.0–8.3)

## 2019-10-12 LAB — HEMOGLOBIN A1C: Hgb A1c MFr Bld: 6.1 % (ref 4.6–6.5)

## 2019-10-12 NOTE — Progress Notes (Signed)
Subjective:    Patient ID: Patrick Thompson, male    DOB: Oct 01, 1950, 69 y.o.   MRN: [id number]  HPI Chief Complaint  Patient presents with  . Annual Exam    no concerns   This is a 69 yo male who presents today for annual well man check up.  He reports that he has been doing well.    Last CPE- 10/2018 PSA- 10/2018 Colonoscopy-05/27/2018 Tdap- unknown Covid vaccine- plans to have Pneumonia vaccines- had Prevnar 13 10/30/2018 Flu- never Dental- overdue Eye- overdue, 09/2017 Exercise- walking most days    Review of Systems  Constitutional: Negative.   HENT: Positive for hearing loss (right x 20 years).   Eyes: Negative.   Respiratory: Negative.   Cardiovascular: Negative.   Gastrointestinal: Negative.   Endocrine: Negative.   Genitourinary: Negative.   Musculoskeletal: Negative for back pain (no problems following surgery).       Occasional popping of right knee. No pain or falls.   Skin: Negative.   Allergic/Immunologic: Positive for environmental allergies.  Neurological: Negative.   Hematological: Negative.   Psychiatric/Behavioral: Negative.        Objective:   Physical Exam Vitals reviewed.  Constitutional:      Appearance: Normal appearance. He is normal weight.  HENT:     Head: Normocephalic and atraumatic.     Right Ear: Tympanic membrane, ear canal and external ear normal.     Left Ear: Tympanic membrane, ear canal and external ear normal.  Eyes:     Conjunctiva/sclera: Conjunctivae normal.  Cardiovascular:     Rate and Rhythm: Normal rate and regular rhythm.     Pulses: Normal pulses.     Heart sounds: Normal heart sounds.  Pulmonary:     Effort: Pulmonary effort is normal.     Breath sounds: Normal breath sounds.  Musculoskeletal:        General: Normal range of motion.     Cervical back: Normal range of motion and neck supple. No rigidity or tenderness.     Right lower leg: No edema.     Left lower leg: No edema.  Lymphadenopathy:   Cervical: No cervical adenopathy.  Skin:    General: Skin is warm and dry.  Neurological:     Mental Status: He is alert and oriented to person, place, and time.  Psychiatric:        Mood and Affect: Mood normal.        Behavior: Behavior normal.        Thought Content: Thought content normal.        Judgment: Judgment normal.       BP 134/86 (BP Location: Left Arm, Patient Position: Sitting, Cuff Size: Normal)   Pulse 76   Temp 97.6 F (36.4 C) (Temporal)   Ht 5\' 11"  (1.803 m)   Wt 211 lb 6.4 oz (95.9 kg)   SpO2 97%   BMI 29.48 kg/m  Wt Readings from Last 3 Encounters:  10/12/19 211 lb 6.4 oz (95.9 kg)  10/15/18 213 lb 8 oz (96.8 kg)  05/27/18 204 lb (92.5 kg)   Depression screen [facility] 2/9 10/12/2019 10/15/2018 06/28/2017  Decreased Interest 0 0 0  Down, Depressed, Hopeless 0 0 0  PHQ - 2 Score 0 0 0  Altered sleeping - 0 -  Tired, decreased energy - 0 -  Change in appetite - 0 -  Feeling bad or failure about yourself  - 0 -  Trouble concentrating - 0 -  Moving slowly  or fidgety/restless - 0 -  Suicidal thoughts - 0 -  PHQ-9 Score - 0 -  Difficult doing work/chores - Not difficult at all -       Assessment & Plan:  1. Annual physical exam - Discussed and encouraged healthy lifestyle choices- adequate sleep, regular exercise, stress management and healthy food choices.    2. HNP (herniated nucleus pulposus), lumbar -Has done well following surgery in 2018  3. Mixed hyperlipidemia -Tolerating atorvastatin without side effects - Lipid Panel - Comprehensive metabolic panel  4. Prediabetes -Weight stable - Lipid Panel - Hemoglobin A1c - Comprehensive metabolic panel  5. Vaccine counseling -Discussed importance of COVID-19 vaccine and provided information for him to call and schedule -Is a little early for him to have his Pneumovax 23, will defer at this time.  Once he has had his Covid vaccine, he can get Pneumovax 23 and Tdap.  -Follow-up in 1 year if labs  stable   Clarene Reamer, FNP-BC  [facility], [place]  10/12/2019 11:27 AM

## 2019-10-12 NOTE — Patient Instructions (Addendum)
COVID-19 Vaccine Information can be found at: [email or web address].uk For questions related to vaccine distribution or appointments, please email [email or web address] or call [phone].    Preventive Care 68 Years and Older, Male Preventive care refers to lifestyle choices and visits with your health care provider that can promote health and wellness. This includes:  A yearly physical exam. This is also called an annual well check.  Regular dental and eye exams.  Immunizations.  Screening for certain conditions.  Healthy lifestyle choices, such as diet and exercise. What can I expect for my preventive care visit? Physical exam Your health care provider will check:  Height and weight. These may be used to calculate body mass index (BMI), which is a measurement that tells if you are at a healthy weight.  Heart rate and blood pressure.  Your skin for abnormal spots. Counseling Your health care provider may ask you questions about:  Alcohol, tobacco, and drug use.  Emotional well-being.  Home and relationship well-being.  Sexual activity.  Eating habits.  History of falls.  Memory and ability to understand (cognition).  Work and work Statistician. What immunizations do I need?  Influenza (flu) vaccine  This is recommended every year. Tetanus, diphtheria, and pertussis (Tdap) vaccine  You may need a Td booster every 10 years. Varicella (chickenpox) vaccine  You may need this vaccine if you have not already been vaccinated. Zoster (shingles) vaccine  You may need this after age 58. Pneumococcal conjugate (PCV13) vaccine  One dose is recommended after age 70. Pneumococcal polysaccharide (PPSV23) vaccine  One dose is recommended after age 52. Measles, mumps, and rubella (MMR) vaccine  You may need at least one dose of MMR if you were born in 1957 or later. You may also need a second dose. Meningococcal  conjugate (MenACWY) vaccine  You may need this if you have certain conditions. Hepatitis A vaccine  You may need this if you have certain conditions or if you travel or work in places where you may be exposed to hepatitis A. Hepatitis B vaccine  You may need this if you have certain conditions or if you travel or work in places where you may be exposed to hepatitis B. Haemophilus influenzae type b (Hib) vaccine  You may need this if you have certain conditions. You may receive vaccines as individual doses or as more than one vaccine together in one shot (combination vaccines). Talk with your health care provider about the risks and benefits of combination vaccines. What tests do I need? Blood tests  Lipid and cholesterol levels. These may be checked every 5 years, or more frequently depending on your overall health.  Hepatitis C test.  Hepatitis B test. Screening  Lung cancer screening. You may have this screening every year starting at age 62 if you have a 30-pack-year history of smoking and currently smoke or have quit within the past 15 years.  Colorectal cancer screening. All adults should have this screening starting at age 19 and continuing until age 31. Your health care provider may recommend screening at age 58 if you are at increased risk. You will have tests every 1-10 years, depending on your results and the type of screening test.  Prostate cancer screening. Recommendations will vary depending on your family history and other risks.  Diabetes screening. This is done by checking your blood sugar (glucose) after you have not eaten for a while (fasting). You may have this done every 1-3 years.  Abdominal aortic aneurysm (AAA) screening. You may need  this if you are a current or former smoker.  Sexually transmitted disease (STD) testing. Follow these instructions at home: Eating and drinking  Eat a diet that includes fresh fruits and vegetables, whole grains, lean  protein, and low-fat dairy products. Limit your intake of foods with high amounts of sugar, saturated fats, and salt.  Take vitamin and mineral supplements as recommended by your health care provider.  Do not drink alcohol if your health care provider tells you not to drink.  If you drink alcohol: ? Limit how much you have to 0-2 drinks a day. ? Be aware of how much alcohol is in your drink. In the [place] one drink equals one 12 oz bottle of beer (355 mL), one 5 oz glass of wine (148 mL), or one 1 oz glass of hard liquor (44 mL). Lifestyle  Take daily care of your teeth and gums.  Stay active. Exercise for at least 30 minutes on 5 or more days each week.  Do not use any products that contain nicotine or tobacco, such as cigarettes, e-cigarettes, and chewing tobacco. If you need help quitting, ask your health care provider.  If you are sexually active, practice safe sex. Use a condom or other form of protection to prevent STIs (sexually transmitted infections).  Talk with your health care provider about taking a low-dose aspirin or statin. What's next?  Visit your health care provider once a year for a well check visit.  Ask your health care provider how often you should have your eyes and teeth checked.  Stay up to date on all vaccines. This information is not intended to replace advice given to you by your health care provider. Make sure you discuss any questions you have with your health care provider. Document Revised: 04/17/2018 Document Reviewed: 04/17/2018 Elsevier Patient Education  2020 [employer].

## 2019-10-16 MED ORDER — EPINEPHRINE 0.3 MG/0.3ML IJ SOAJ: INTRAMUSCULAR | 1 refills | Status: DC

## 2019-10-16 MED ORDER — ATORVASTATIN CALCIUM 20 MG PO TABS: 20.0000 mg | ORAL_TABLET | Freq: Every day | ORAL | 3 refills | Status: DC

## 2019-10-16 NOTE — Addendum Note (Signed)
Addended by: Clarene Reamer B on: 10/16/2019 07:57 AM   Modules accepted: Orders

## 2021-05-11 DIAGNOSIS — H25032 Anterior subcapsular polar age-related cataract, left eye: Secondary | ICD-10-CM | POA: Diagnosis not present

## 2021-05-11 DIAGNOSIS — H524 Presbyopia: Secondary | ICD-10-CM | POA: Diagnosis not present

## 2021-05-11 DIAGNOSIS — H5203 Hypermetropia, bilateral: Secondary | ICD-10-CM | POA: Diagnosis not present

## 2021-05-11 DIAGNOSIS — H52223 Regular astigmatism, bilateral: Secondary | ICD-10-CM | POA: Diagnosis not present

## 2021-05-11 DIAGNOSIS — H4322 Crystalline deposits in vitreous body, left eye: Secondary | ICD-10-CM | POA: Diagnosis not present

## 2021-05-17 ENCOUNTER — Other Ambulatory Visit: Payer: Self-pay

## 2021-05-17 ENCOUNTER — Encounter: Payer: Self-pay | Admitting: Family

## 2021-05-17 ENCOUNTER — Ambulatory Visit (INDEPENDENT_AMBULATORY_CARE_PROVIDER_SITE_OTHER): Payer: Medicare HMO | Admitting: Family

## 2021-05-17 VITALS — BP 146/84 | HR 71 | Temp 96.9°F | Ht 71.0 in | Wt 212.0 lb

## 2021-05-17 DIAGNOSIS — Z125 Encounter for screening for malignant neoplasm of prostate: Secondary | ICD-10-CM | POA: Insufficient documentation

## 2021-05-17 DIAGNOSIS — E782 Mixed hyperlipidemia: Secondary | ICD-10-CM

## 2021-05-17 DIAGNOSIS — R030 Elevated blood-pressure reading, without diagnosis of hypertension: Secondary | ICD-10-CM | POA: Insufficient documentation

## 2021-05-17 DIAGNOSIS — R7303 Prediabetes: Secondary | ICD-10-CM | POA: Insufficient documentation

## 2021-05-17 DIAGNOSIS — Z0001 Encounter for general adult medical examination with abnormal findings: Secondary | ICD-10-CM | POA: Insufficient documentation

## 2021-05-17 DIAGNOSIS — Z91030 Bee allergy status: Secondary | ICD-10-CM | POA: Insufficient documentation

## 2021-05-17 HISTORY — DX: Elevated blood-pressure reading, without diagnosis of hypertension: R03.0

## 2021-05-17 LAB — CBC WITH DIFFERENTIAL/PLATELET
Basophils Absolute: 0.1 10*3/uL (ref 0.0–0.1)
Basophils Relative: 0.9 % (ref 0.0–3.0)
Eosinophils Absolute: 0.2 10*3/uL (ref 0.0–0.7)
Eosinophils Relative: 2 % (ref 0.0–5.0)
HCT: 42 % (ref 39.0–52.0)
Hemoglobin: 13.8 g/dL (ref 13.0–17.0)
Lymphocytes Relative: 21 % (ref 12.0–46.0)
Lymphs Abs: 2.1 10*3/uL (ref 0.7–4.0)
MCHC: 32.9 g/dL (ref 30.0–36.0)
MCV: 85.3 fl (ref 78.0–100.0)
Monocytes Absolute: 0.6 10*3/uL (ref 0.1–1.0)
Monocytes Relative: 5.8 % (ref 3.0–12.0)
Neutro Abs: 7 10*3/uL (ref 1.4–7.7)
Neutrophils Relative %: 70.3 % (ref 43.0–77.0)
Platelets: 313 10*3/uL (ref 150.0–400.0)
RBC: 4.93 Mil/uL (ref 4.22–5.81)
RDW: 15.4 % (ref 11.5–15.5)
WBC: 9.9 10*3/uL (ref 4.0–10.5)

## 2021-05-17 LAB — COMPREHENSIVE METABOLIC PANEL
ALT: 17 U/L (ref 0–53)
AST: 15 U/L (ref 0–37)
Albumin: 4.4 g/dL (ref 3.5–5.2)
Alkaline Phosphatase: 74 U/L (ref 39–117)
BUN: 18 mg/dL (ref 6–23)
CO2: 28 mEq/L (ref 19–32)
Calcium: 9.2 mg/dL (ref 8.4–10.5)
Chloride: 107 mEq/L (ref 96–112)
Creatinine, Ser: 1.22 mg/dL (ref 0.40–1.50)
GFR: 60.65 mL/min (ref >60.00)
Glucose, Bld: 107 mg/dL — ABNORMAL HIGH (ref 70–99)
Potassium: 4.5 mEq/L (ref 3.5–5.1)
Sodium: 142 mEq/L (ref 135–145)
Total Bilirubin: 0.4 mg/dL (ref 0.2–1.2)
Total Protein: 7.4 g/dL (ref 6.0–8.3)

## 2021-05-17 LAB — LIPID PANEL
Cholesterol: 222 mg/dL — ABNORMAL HIGH (ref 0–200)
HDL: 32.2 mg/dL — ABNORMAL LOW (ref >39.00)
LDL Cholesterol: 150 mg/dL — ABNORMAL HIGH (ref 0–99)
NonHDL: 189.58
Total CHOL/HDL Ratio: 7
Triglycerides: 196 mg/dL — ABNORMAL HIGH (ref 0.0–149.0)
VLDL: 39.2 mg/dL (ref 0.0–40.0)

## 2021-05-17 LAB — HEMOGLOBIN A1C: Hgb A1c MFr Bld: 6 % (ref 4.6–6.5)

## 2021-05-17 LAB — PSA: PSA: 3.97 ng/mL (ref 0.10–4.00)

## 2021-05-17 MED ORDER — EPINEPHRINE 0.3 MG/0.3ML IJ SOAJ: INTRAMUSCULAR | 1 refills | Status: AC

## 2021-05-17 MED ORDER — ATORVASTATIN CALCIUM 20 MG PO TABS: 20.0000 mg | ORAL_TABLET | Freq: Every day | ORAL | 1 refills | Status: DC

## 2021-05-17 NOTE — Patient Instructions (Addendum)
Your blood pressure was elevated today, I suggest you buy a blood pressure cuff.  Start monitoring your blood pressure daily, around the same time of day, for the next 2-3 weeks.  Ensure that you have rested for 30 minutes prior to checking your blood pressure. Record your readings and bring them to your next visit.  You are due for your repeat colonoscopy after 05/27/2021, please give Dr. Henrene Pastor a call at the following location:  [facility] [address] [place], [place] [address] [phone]  You are due for the following vaccinations as we discussed during hte visit:  Tetanus vaccination (every ten years) Pneumonia vaccine (pneumonococcal 23) Influenza vaccination (if desired) Shingles vaccination (Shingrix) Covid vaccinations (if desired)   Stop by the lab prior to leaving today. I will notify you of your results once received.   Recommendations on keeping yourself healthy:  - Exercise at least 30-45 minutes a day, 3-4 days a week.  - Eat a low-fat diet with lots of fruits and vegetables, up to 7-9 servings per day.  - Seatbelts can save your life. Wear them always.  - Smoke detectors on every level of your home, check batteries every year.  - Eye Doctor - have an eye exam every 1-2 years  - Safe sex - if you may be exposed to STDs, use a condom.  - Alcohol -  If you drink, do it moderately, less than 2 drinks per day.  - [place]. Choose someone to speak for you if you are not able.  - Depression is common in our stressful world.If you're feeling down or losing interest in things you normally enjoy, please come in for a visit.  - Violence - If anyone is threatening or hurting you, please call immediately.  Due to recent changes in healthcare laws, you may see results of your imaging and/or laboratory studies on MyChart before I have had a chance to review them.  I understand that in some cases there may be results that are confusing or  concerning to you. Please understand that not all results are received at the same time and often I may need to interpret multiple results in order to provide you with the best plan of care or course of treatment. Therefore, I ask that you please give me 2 business days to thoroughly review all your results before contacting my office for clarification. Should we see a critical lab result, you will be contacted sooner.   I will see you again in one year for your annual comprehensive exam unless otherwise stated and or with acute concerns.  It was a pleasure seeing you today! Please do not hesitate to reach out with any questions and or concerns.  Regards,   Eugenia Pancoast

## 2021-05-17 NOTE — Assessment & Plan Note (Signed)
Pt advised of the following: Start monitoring your blood pressure daily, around the same time of day, for the next 2-3 weeks.  Ensure that you have rested for 30 minutes prior to checking your blood pressure. Record your readings and bring them to your next visit. Work on a low sodium diet.

## 2021-05-17 NOTE — Assessment & Plan Note (Signed)
Refilled epi pen as expired. Pt to keep on him at all times

## 2021-05-17 NOTE — Assessment & Plan Note (Signed)
Ordered hga1c today pending results. Work on diabetic diet and exercise as tolerated.

## 2021-05-17 NOTE — Assessment & Plan Note (Signed)
Patient Counseling(The following topics were reviewed):  -Nutrition: Stressed importance of moderation in sodium/caffeine intake, saturated fat and cholesterol, caloric balance, sufficient intake of fresh fruits, vegetables, and fiber.  -Stressed the importance of regular exercise.   -Substance Abuse: Discussed cessation/primary prevention of tobacco, alcohol, or other drug use; driving or other dangerous activities under the influence; availability of treatment for abuse.   -Injury prevention: Discussed safety belts, safety helmets, smoke detector, smoking near bedding or upholstery.   -Sexuality: Discussed sexually transmitted diseases, partner selection, use of condoms, avoidance of unintended pregnancy and contraceptive alternatives.   -Dental health: Discussed importance of regular tooth brushing, flossing, and dental visits.  -Health maintenance and immunizations reviewed. Please refer to Health maintenance section.  Return to care in 1 year for next preventative visit

## 2021-05-17 NOTE — Progress Notes (Signed)
Established Patient Office Visit  Subjective:  Patient ID: Patrick Thompson, male    DOB: 1950-06-01  Age: 71 y.o. MRN: [id number]  CC:  Chief Complaint  Patient presents with   Transitions Of Care    HPI Alessandro Griep is here today for a transition of care visit.  Patient was previously seeing Tor Netters, [place]. Pt is without acute concerns.   Colonscopy: due Q3 years, last one was 05/27/18, due this year. Was with Dr. Henrene Pastor, will call to make an appt.    Chronic problems addressed today:  HTN: past h/o resolution, however slightly elevated at today's visit. No blurry vision, no headaches. Doesn't recall meds he took in the past. Doesn't check blood pressure at home.   HLD: has been out of lipitor for some time. Walks daily, about one mile a day.   Hyperglycemia: does like carbs/ not really sugar. Likes pasta/carbs/breads.  Lab Results  Component Value Date   HGBA1C 6.1 10/12/2019   Vaccinations recommended for patient; however, pt refuses. He is aware of which he is due for will hold off for now.  Past Medical History:  Diagnosis Date   Allergy    anaphylaxis with bee sting   BPH (benign prostatic hyperplasia)    2018, pt does not recollect this   HNP (herniated nucleus pulposus), lumbar 04/23/2017   Hypertension    No longer on medication   Prediabetes    Syncope 10/20/2015   Tobacco abuse    quit 2022    Past Surgical History:  Procedure Laterality Date   LUMBAR LAMINECTOMY/DECOMPRESSION MICRODISCECTOMY Bilateral 04/23/2017   Procedure: L4-5 microdiscectomy;  Surgeon: Kary Kos, MD;  Location: [facility];  Service: Neurosurgery;  Laterality: Bilateral;    Family History  Problem Relation Age of Onset   Hypertension Mother    Diabetes Mother    Cancer Father        Prostate    Colon cancer Father    Heart disease Brother        101 brother, MI in late 49s   Esophageal cancer Neg Hx    Rectal cancer Neg Hx    Stomach cancer Neg Hx     Social History    Socioeconomic History   Marital status: Single    Spouse name: Not on file   Number of children: Not on file   Years of education: Not on file   Highest education level: Not on file  Occupational History   Occupation: retired  Tobacco Use   Smoking status: Former    Packs/day: 0.10    Years: 40.00    Pack years: 4.00    Types: Cigarettes    Quit date: 2022    Years since quitting: 1.0   Smokeless tobacco: Never  Vaping Use   Vaping Use: Never used  Substance and Sexual Activity   Alcohol use: Not Currently    Alcohol/week: 0.0 standard drinks    Comment: quit june 2018   Drug use: No   Sexual activity: Yes    Partners: Female  Other Topics Concern   Not on file  Social History Narrative   Lives in [place]   6/21- has not talked to family about advanced directives. Would want CPR/ intubation/ artificial nutrition if chance to return to normal function. Has printed information at home regarding HCPOA/ Living Will.    Social Determinants of Health   Financial Resource Strain: Not on file  Food Insecurity: Not on file  Transportation Needs: Not on  file  Physical Activity: Not on file  Stress: Not on file  Social Connections: Not on file  Intimate Partner Violence: Not on file    Outpatient Medications Prior to Visit  Medication Sig Dispense Refill   atorvastatin (LIPITOR) 20 MG tablet Take 1 tablet (20 mg total) by mouth daily. 90 tablet 3   EPINEPHrine 0.3 mg/0.3 mL IJ SOAJ injection INJECT 1 PEN INTO THE MUSCLE AS DIRECTED FOR SEVERE ALLERGIC REACTION, THEN GO TO ER 1 each 1   No facility-administered medications prior to visit.    Allergies  Allergen Reactions   Bee Venom Anaphylaxis and Hives    Has epi pen     ROS Review of Systems  Review of Systems  Respiratory:  Negative for shortness of breath.   Cardiovascular:  Negative for chest pain and palpitations.  Gastrointestinal:  Negative for constipation and diarrhea.  Genitourinary:  Negative for  dysuria, frequency and urgency.  Musculoskeletal:  Negative for myalgias.  Psychiatric/Behavioral:  Negative for depression and suicidal ideas.   All other systems reviewed and are negative.    Objective:    Physical Exam  Gen: NAD, resting comfortably HEENT: TMs normal bilaterally. OP clear. No thyromegaly noted.  CV: RRR with no murmurs appreciated Pulm: NWOB, CTAB with no crackles, wheezes, or rhonchi GI: Normal bowel sounds present. Soft, Nontender, Nondistended. MSK: no edema, cyanosis, or clubbing noted Skin:warm, dry  Psych: Normal affect and thought content  BP (!) 146/84    Pulse 71    Temp (!) 96.9 F (36.1 C) (Temporal)    Ht 5\' 11"  (1.803 m)    Wt 212 lb (96.2 kg)    SpO2 96%    BMI 29.57 kg/m  Wt Readings from Last 3 Encounters:  05/17/21 212 lb (96.2 kg)  10/12/19 211 lb 6.4 oz (95.9 kg)  10/15/18 213 lb 8 oz (96.8 kg)     There are no preventive care reminders to display for this patient.   There are no preventive care reminders to display for this patient.  No results found for: TSH Lab Results  Component Value Date   WBC 7.4 04/23/2017   HGB 14.7 04/23/2017   HCT 42.8 04/23/2017   MCV 85.3 04/23/2017   PLT 299 04/23/2017   Lab Results  Component Value Date   NA 137 10/12/2019   K 4.4 10/12/2019   CO2 28 10/12/2019   GLUCOSE 91 10/12/2019   BUN 15 10/12/2019   CREATININE 1.15 10/12/2019   BILITOT 0.4 10/12/2019   ALKPHOS 99 10/12/2019   AST 16 10/12/2019   ALT 21 10/12/2019   PROT 7.4 10/12/2019   ALBUMIN 4.5 10/12/2019   CALCIUM 9.4 10/12/2019   ANIONGAP 9 04/23/2017   GFR 76.63 10/12/2019   Lab Results  Component Value Date   CHOL 144 10/12/2019   Lab Results  Component Value Date   HDL 31.00 (L) 10/12/2019   Lab Results  Component Value Date   LDLCALC 84 10/12/2019   Lab Results  Component Value Date   TRIG 141.0 10/12/2019   Lab Results  Component Value Date   CHOLHDL 5 10/12/2019   Lab Results  Component Value Date    HGBA1C 6.1 10/12/2019      Assessment & Plan:   Problem List Items Addressed This Visit       Other   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 Pt to keep on him at all times  Relevant Medications   EPINEPHrine 0.3 mg/0.3 mL IJ SOAJ injection   Elevated blood pressure reading    Pt advised of the following: Start monitoring your blood pressure daily, around the same time of day, for the next 2-3 weeks.  Ensure that you have rested for 30 minutes prior to checking your blood pressure. Record your readings and bring them to your next visit. Work on a low sodium diet.      Encounter for annual general medical examination with abnormal findings in adult    Patient Counseling(The following topics were reviewed):  -Nutrition: Stressed importance of moderation in sodium/caffeine intake, saturated fat and cholesterol, caloric balance, sufficient intake of fresh fruits, vegetables, and fiber.  -Stressed the importance of regular exercise.   -Substance Abuse: Discussed cessation/primary prevention of tobacco, alcohol, or other drug use; driving or other dangerous activities under the influence; availability of treatment for abuse.   -Injury prevention: Discussed safety belts, safety helmets, smoke detector, smoking near bedding or upholstery.   -Sexuality: Discussed sexually transmitted diseases, partner selection, use of condoms, avoidance of unintended pregnancy and contraceptive alternatives.   -Dental health: Discussed importance of regular tooth brushing, flossing, and dental visits.  -Health maintenance and immunizations reviewed. Please refer to Health maintenance section.  Return to care in 1 year for next preventative visit       Relevant Orders   CBC w/Diff   Comprehensive metabolic panel   Prediabetes    Ordered hga1c today pending results. Work on diabetic diet and exercise as tolerated.       Relevant Orders   Hemoglobin A1c   Screening for  malignant neoplasm of prostate    psa ordered pending results      Relevant Orders   PSA   Mixed hyperlipidemia    refilled for atorvastatin sent to pharmacy. Ordered lipid panel, pending results. Work on low cholesterol diet and exercise as tolerated       Relevant Medications   EPINEPHrine 0.3 mg/0.3 mL IJ SOAJ injection   atorvastatin (LIPITOR) 20 MG tablet   Other Relevant Orders   Lipid Profile    Meds ordered this encounter  Medications   EPINEPHrine 0.3 mg/0.3 mL IJ SOAJ injection    Sig: INJECT 1 PEN INTO THE MUSCLE AS DIRECTED FOR SEVERE ALLERGIC REACTION, THEN GO TO ER    Dispense:  1 each    Refill:  1    Order Specific Question:   Supervising Provider    Answer:   BEDSOLE, AMY E [[id number]]   atorvastatin (LIPITOR) 20 MG tablet    Sig: Take 1 tablet (20 mg total) by mouth daily.    Dispense:  90 tablet    Refill:  1    Order Specific Question:   Supervising Provider    Answer:   Diona Browner, AMY E [[id number]]    Follow-up: Return in about 3 weeks (around 06/07/2021) for for follow up on blood pressure, bring  blood pressure log.    Eugenia Pancoast, FNP

## 2021-05-17 NOTE — Assessment & Plan Note (Signed)
psa ordered pending results 

## 2021-05-17 NOTE — Assessment & Plan Note (Signed)
refilled for atorvastatin sent to pharmacy. Ordered lipid panel, pending results. Work on low cholesterol diet and exercise as tolerated

## 2021-05-18 ENCOUNTER — Telehealth: Payer: Self-pay | Admitting: Family

## 2021-05-18 NOTE — Telephone Encounter (Signed)
Pt is returning a call to get his labs results . Would like a call back [phone]

## 2021-05-18 NOTE — Progress Notes (Signed)
Patient's PSA is still within normal limits however however is on the higher end of normal.  Asked patient if he is experiencing any urinary frequency, and/or if he has any trouble starting a stream of urine.  His hemoglobin A1c is stable which still puts him in the range of prediabetes.  His cholesterol is very elevated however patient was out of his atorvastatin for some time so I do want patient to restart the cholesterol medication and we will repeat this in 3 months.  Make sure the patient is scheduled for follow-up in 3 months please

## 2021-05-18 NOTE — Telephone Encounter (Signed)
Completed in result notes.

## 2021-08-16 ENCOUNTER — Encounter: Payer: Self-pay | Admitting: Family

## 2021-08-16 ENCOUNTER — Ambulatory Visit (INDEPENDENT_AMBULATORY_CARE_PROVIDER_SITE_OTHER): Payer: Medicare HMO | Admitting: Family

## 2021-08-16 VITALS — BP 138/82 | HR 66 | Temp 98.3°F | Resp 16 | Ht 71.0 in | Wt 207.2 lb

## 2021-08-16 DIAGNOSIS — R7303 Prediabetes: Secondary | ICD-10-CM

## 2021-08-16 DIAGNOSIS — B354 Tinea corporis: Secondary | ICD-10-CM | POA: Diagnosis not present

## 2021-08-16 DIAGNOSIS — Z79899 Other long term (current) drug therapy: Secondary | ICD-10-CM

## 2021-08-16 DIAGNOSIS — E782 Mixed hyperlipidemia: Secondary | ICD-10-CM

## 2021-08-16 LAB — LIPID PANEL
Cholesterol: 115 mg/dL (ref 0–200)
HDL: 30.6 mg/dL — ABNORMAL LOW (ref >39.00)
LDL Cholesterol: 65 mg/dL (ref 0–99)
NonHDL: 84.7
Total CHOL/HDL Ratio: 4
Triglycerides: 99 mg/dL (ref 0.0–149.0)
VLDL: 19.8 mg/dL (ref 0.0–40.0)

## 2021-08-16 LAB — POCT GLYCOSYLATED HEMOGLOBIN (HGB A1C): Hemoglobin A1C: 5.9 % — AB (ref 4.0–5.6)

## 2021-08-16 LAB — HEPATIC FUNCTION PANEL
ALT: 27 U/L (ref 0–53)
AST: 18 U/L (ref 0–37)
Albumin: 4.3 g/dL (ref 3.5–5.2)
Alkaline Phosphatase: 69 U/L (ref 39–117)
Bilirubin, Direct: 0.1 mg/dL (ref 0.0–0.3)
Total Bilirubin: 0.4 mg/dL (ref 0.2–1.2)
Total Protein: 7.4 g/dL (ref 6.0–8.3)

## 2021-08-16 MED ORDER — CLOTRIMAZOLE-BETAMETHASONE 1-0.05 % EX CREA: 1.0000 "application " | TOPICAL_CREAM | Freq: Two times a day (BID) | CUTANEOUS | 0 refills | Status: AC

## 2021-08-16 MED ORDER — CLOTRIMAZOLE-BETAMETHASONE 1-0.05 % EX CREA: 1.0000 "application " | TOPICAL_CREAM | Freq: Two times a day (BID) | CUTANEOUS | 0 refills | Status: DC

## 2021-08-16 MED ORDER — ATORVASTATIN CALCIUM 20 MG PO TABS: 20.0000 mg | ORAL_TABLET | Freq: Every day | ORAL | 1 refills | Status: DC

## 2021-08-16 NOTE — Assessment & Plan Note (Signed)
hga1c reviewed today, improvement which is great.  ? Work on diabetic diet and exercise as tolerated. Yearly foot exam, and annual eye exam.  ? ?

## 2021-08-16 NOTE — Assessment & Plan Note (Signed)
Ordered lipid panel, pending results. Work on low cholesterol diet and exercise as tolerated ?rrx refill sent for lipitor 10 mg ?

## 2021-08-16 NOTE — Assessment & Plan Note (Signed)
Failed relief with otc hydrocortisone ?rx for lotrisone 1-.05% ?

## 2021-08-16 NOTE — Patient Instructions (Signed)
Stop by the lab prior to leaving today. I will notify you of your results once received.   Due to recent changes in healthcare laws, you may see results of your imaging and/or laboratory studies on MyChart before I have had a chance to review them.  I understand that in some cases there may be results that are confusing or concerning to you. Please understand that not all results are received at the same time and often I may need to interpret multiple results in order to provide you with the best plan of care or course of treatment. Therefore, I ask that you please give me 2 business days to thoroughly review all your results before contacting my office for clarification. Should we see a critical lab result, you will be contacted sooner.   It was a pleasure seeing you today! Please do not hesitate to reach out with any questions and or concerns.  Regards,   Shaketa Serafin FNP-C  

## 2021-08-16 NOTE — Assessment & Plan Note (Signed)
lft ordered pending results ?

## 2021-08-16 NOTE — Progress Notes (Signed)
? ?Established Patient Office Visit ? ?Subjective:  ?Patient ID: Patrick Thompson, male    DOB: [birth year]  Age: 71 y.o. MRN: [id number] ? ?CC:  ?Chief Complaint  ?Patient presents with  ? Prediabetes  ? ? ?HPI ?Patrick Thompson is here today for follow up.  ?Pt is without acute concerns. ? ?Prediabtes:  ?Lab Results  ?Component Value Date  ? HGBA1C 5.9 (A) 08/16/2021  ? ?HLD: had ran out of his lipitor, now back on it for the last three months. Tolerating well.  ?Walks daily for about thirty minutes at a time.  ?Lab Results  ?Component Value Date  ? CHOL 222 (H) 05/17/2021  ? HDL 32.20 (L) 05/17/2021  ? LDLCALC 150 (H) 05/17/2021  ? LDLDIRECT 140.0 10/15/2018  ? TRIG 196.0 (H) 05/17/2021  ? CHOLHDL 7 05/17/2021  ? ?Psa, normal but on higher than normal range: denies denies urinary urgency, frequency, and or dysuria. Not hard to start a stream of urine.  ?Lab Results  ?Component Value Date  ? PSA 3.97 05/17/2021  ? PSA 0.48 10/15/2018  ? ? ?Elevated bp: pt has not been checking at home. Today only mild, 138/82, no cp palp or sob. ? ?Past Medical History:  ?Diagnosis Date  ? Allergy   ? anaphylaxis with bee sting  ? BPH (benign prostatic hyperplasia)   ? 2018, pt does not recollect this  ? Elevated blood pressure reading 05/17/2021  ? Elevated at visit 1/11 D/w pt blood pressure record and f/u in three weeks  ? HNP (herniated nucleus pulposus), lumbar 04/23/2017  ? Hypertension   ? No longer on medication  ? Prediabetes   ? Syncope 10/20/2015  ? Tobacco abuse   ? quit 2022  ? ? ?Past Surgical History:  ?Procedure Laterality Date  ? LUMBAR LAMINECTOMY/DECOMPRESSION MICRODISCECTOMY Bilateral 04/23/2017  ? Procedure: L4-5 microdiscectomy;  Surgeon: Kary Kos, MD;  Location: [facility];  Service: Neurosurgery;  Laterality: Bilateral;  ? ? ?Family History  ?Problem Relation Age of Onset  ? Hypertension Mother   ? Diabetes Mother   ? Cancer Father   ?     Prostate   ? Colon cancer Father   ? Heart disease Brother   ?     Half  brother, MI in late 6s  ? Esophageal cancer Neg Hx   ? Rectal cancer Neg Hx   ? Stomach cancer Neg Hx   ? ? ?Social History  ? ?Socioeconomic History  ? Marital status: Single  ?  Spouse name: Not on file  ? Number of children: Not on file  ? Years of education: Not on file  ? Highest education level: Not on file  ?Occupational History  ? Occupation: retired  ?Tobacco Use  ? Smoking status: Former  ?  Packs/day: 0.10  ?  Years: 40.00  ?  Pack years: 4.00  ?  Types: Cigarettes  ?  Quit date: 2022  ?  Years since quitting: 1.2  ? Smokeless tobacco: Never  ?Vaping Use  ? Vaping Use: Never used  ?Substance and Sexual Activity  ? Alcohol use: Not Currently  ?  Alcohol/week: 0.0 standard drinks  ?  Comment: quit june 2018  ? Drug use: No  ? Sexual activity: Yes  ?  Partners: Female  ?Other Topics Concern  ? Not on file  ?Social History Narrative  ? Lives in [place]  ? 6/21- has not talked to family about advanced directives. Would want CPR/ intubation/ artificial nutrition if chance to return to  normal function. Has printed information at home regarding HCPOA/ Living Will.   ? ?Social Determinants of Health  ? ?Financial Resource Strain: Not on file  ?Food Insecurity: Not on file  ?Transportation Needs: Not on file  ?Physical Activity: Not on file  ?Stress: Not on file  ?Social Connections: Not on file  ?Intimate Partner Violence: Not on file  ? ? ?Outpatient Medications Prior to Visit  ?Medication Sig Dispense Refill  ? EPINEPHrine 0.3 mg/0.3 mL IJ SOAJ injection INJECT 1 PEN INTO THE MUSCLE AS DIRECTED FOR SEVERE ALLERGIC REACTION, THEN GO TO ER 1 each 1  ? atorvastatin (LIPITOR) 20 MG tablet Take 1 tablet (20 mg total) by mouth daily. 90 tablet 1  ? ?No facility-administered medications prior to visit.  ? ? ?Allergies  ?Allergen Reactions  ? Bee Venom Anaphylaxis and Hives  ?  Has epi pen ?  ? ? ?ROS ?Review of Systems  ?Constitutional:  Negative for chills, fatigue, fever and unexpected weight change.  ?Eyes:   Negative for visual disturbance.  ?Respiratory:  Negative for shortness of breath.   ?Cardiovascular:  Negative for chest pain.  ?Gastrointestinal:  Negative for abdominal pain.  ?Genitourinary:  Negative for decreased urine volume, difficulty urinating, dysuria, frequency, testicular pain and urgency.  ?Skin:  Positive for rash (annular lesion with redness with central clearing, multiple on bil lower anterior extremities, itchy, tried otc steroid cream).  ?Neurological:  Negative for dizziness and headaches.  ? ? ? ?  ?Objective:  ?  ?Physical Exam ? ?Gen: NAD, resting comfortably ?HEENT: TMs normal bilaterally. OP clear. No thyromegaly noted.  ?CV: RRR with no murmfurs appreciated ?Pulm: NWOB, CTAB with no crackles, wheezes, or rhonchi ?Skin: warm, dry, scaly rash with erythema annularly with central clearing, multiple places on bil lower extremities ?Psych: Normal affect and thought content ? ?BP 138/82   Pulse 66   Temp 98.3 ?F (36.8 ?C)   Resp 16   Ht '5\' 11"'$  (1.803 m)   Wt 207 lb 3 oz (94 kg)   SpO2 96%   BMI 28.90 kg/m?  ?Wt Readings from Last 3 Encounters:  ?08/16/21 207 lb 3 oz (94 kg)  ?05/17/21 212 lb (96.2 kg)  ?10/12/19 211 lb 6.4 oz (95.9 kg)  ? ? ? ?Health Maintenance Due  ?Topic Date Due  ? COVID-19 Vaccine (1) Never done  ? Zoster Vaccines- Shingrix (1 of 2) Never done  ? COLONOSCOPY (Pts 45-28yr Insurance coverage will need to be confirmed)  05/27/2021  ? ? ?There are no preventive care reminders to display for this patient. ? ?No results found for: TSH ?Lab Results  ?Component Value Date  ? WBC 9.9 05/17/2021  ? HGB 13.8 05/17/2021  ? HCT 42.0 05/17/2021  ? MCV 85.3 05/17/2021  ? PLT 313.0 05/17/2021  ? ?Lab Results  ?Component Value Date  ? NA 142 05/17/2021  ? K 4.5 05/17/2021  ? CO2 28 05/17/2021  ? GLUCOSE 107 (H) 05/17/2021  ? BUN 18 05/17/2021  ? CREATININE 1.22 05/17/2021  ? BILITOT 0.4 05/17/2021  ? ALKPHOS 74 05/17/2021  ? AST 15 05/17/2021  ? ALT 17 05/17/2021  ? PROT 7.4  05/17/2021  ? ALBUMIN 4.4 05/17/2021  ? CALCIUM 9.2 05/17/2021  ? ANIONGAP 9 04/23/2017  ? GFR 60.65 05/17/2021  ? ?Lab Results  ?Component Value Date  ? CHOL 222 (H) 05/17/2021  ? ?Lab Results  ?Component Value Date  ? HDL 32.20 (L) 05/17/2021  ? ?Lab Results  ?Component Value Date  ?  LDLCALC 150 (H) 05/17/2021  ? ?Lab Results  ?Component Value Date  ? TRIG 196.0 (H) 05/17/2021  ? ?Lab Results  ?Component Value Date  ? CHOLHDL 7 05/17/2021  ? ?Lab Results  ?Component Value Date  ? HGBA1C 5.9 (A) 08/16/2021  ? ? ?  ?Assessment & Plan:  ? ?Problem List Items Addressed This Visit   ? ?  ? Musculoskeletal and Integument  ? Ringworm of body  ?  Failed relief with otc hydrocortisone ?rx for lotrisone 1-.05% ?  ?  ? Relevant Medications  ? clotrimazole-betamethasone (LOTRISONE) cream  ?  ? Other  ? Prediabetes - Primary  ?  hga1c reviewed today, improvement which is great.  ? Work on diabetic diet and exercise as tolerated. Yearly foot exam, and annual eye exam.  ? ?  ?  ? Relevant Orders  ? POCT glycosylated hemoglobin (Hb A1C) (Completed)  ? Mixed hyperlipidemia  ?  Ordered lipid panel, pending results. Work on low cholesterol diet and exercise as tolerated ?rrx refill sent for lipitor 10 mg ?  ?  ? Relevant Medications  ? atorvastatin (LIPITOR) 20 MG tablet  ? Other Relevant Orders  ? Lipid panel  ? Hepatic Function Panel  ? On statin therapy  ?  lft ordered pending results ?  ?  ? Relevant Orders  ? Hepatic Function Panel  ? ? ?Meds ordered this encounter  ?Medications  ? DISCONTD: clotrimazole-betamethasone (LOTRISONE) cream  ?  Sig: Apply 1 application. topically 2 (two) times daily for 7 days.  ?  Dispense:  14 g  ?  Refill:  0  ?  Order Specific Question:   Supervising Provider  ?  Answer:   BEDSOLE, AMY E [[id number]]  ? clotrimazole-betamethasone (LOTRISONE) cream  ?  Sig: Apply 1 application. topically 2 (two) times daily for 7 days.  ?  Dispense:  14 g  ?  Refill:  0  ?  Order Specific Question:   Supervising  Provider  ?  Answer:   BEDSOLE, AMY E [[id number]]  ? atorvastatin (LIPITOR) 20 MG tablet  ?  Sig: Take 1 tablet (20 mg total) by mouth daily.  ?  Dispense:  90 tablet  ?  Refill:  1  ?  Order Specific Question:   Supervising Provider  ?

## 2021-08-17 NOTE — Progress Notes (Signed)
Cholesterol is so much better, great job keep up the good work.  ?

## 2021-09-11 ENCOUNTER — Encounter: Payer: Self-pay | Admitting: Internal Medicine

## 2022-01-17 ENCOUNTER — Telehealth: Payer: Self-pay | Admitting: Family

## 2022-01-17 NOTE — Telephone Encounter (Signed)
Appointment made for Thursday 9.14.23

## 2022-01-17 NOTE — Telephone Encounter (Signed)
Pt call requesting a refill on RX clotrimazole-betamethasone (LOTRISONE) cream  .   Encourage patient to contact the pharmacy for refills or they can request refills through [facility]:  Please schedule appointment if longer than 1 year  NEXT APPOINTMENT DATE:  MEDICATION:clotrimazole-betamethasone (LOTRISONE) cream   Is the patient out of medication?   [place],   Let patient know to contact pharmacy at the end of the day to make sure medication is ready.  Please notify patient to allow 48-72 hours to process  CLINICAL FILLS OUT ALL BELOW:   LAST REFILL:  QTY:  REFILL DATE:    OTHER COMMENTS:    Okay for refill?  Please advise

## 2022-01-17 NOTE — Telephone Encounter (Signed)
Left message to return call to our office. Pt needs an appointment to be seen for this issue

## 2022-01-17 NOTE — Telephone Encounter (Signed)
Pt stated ringworm has return

## 2022-01-18 ENCOUNTER — Encounter: Payer: Self-pay | Admitting: Family

## 2022-01-18 ENCOUNTER — Ambulatory Visit (INDEPENDENT_AMBULATORY_CARE_PROVIDER_SITE_OTHER): Payer: Medicare HMO | Admitting: Family

## 2022-01-18 VITALS — BP 110/78 | HR 60 | Temp 97.6°F | Ht 71.0 in | Wt 208.0 lb

## 2022-01-18 DIAGNOSIS — Z91030 Bee allergy status: Secondary | ICD-10-CM | POA: Diagnosis not present

## 2022-01-18 DIAGNOSIS — B354 Tinea corporis: Secondary | ICD-10-CM

## 2022-01-18 MED ORDER — CLOTRIMAZOLE-BETAMETHASONE 1-0.05 % EX CREA: 1.0000 | TOPICAL_CREAM | Freq: Every day | CUTANEOUS | 1 refills | Status: DC

## 2022-01-18 NOTE — Progress Notes (Signed)
Established Patient Office Visit  Subjective:  Patient ID: Patrick Thompson, male    DOB: 04-09-1951  Age: 71 y.o. MRN: [id number]  CC:  Chief Complaint  Patient presents with   Rash    Was treated before for ringworm in same spot started coming back about a month ago.     HPI Kendel Bessey is here today with concerns.  Rash on left lower anterior shin with itchiness and dry skin in an localized area. He was going to apply some otc medication but decided to come here instead. It does itch and he itches a times. He had this prior at last visit, almost completely resolved, only used cream for 7 days. Lortisone was very helpful.   Past Medical History:  Diagnosis Date   Allergy    anaphylaxis with bee sting   BPH (benign prostatic hyperplasia)    2018, pt does not recollect this   Elevated blood pressure reading 05/17/2021   Elevated at visit 1/11 D/w pt blood pressure record and f/u in three weeks   HNP (herniated nucleus pulposus), lumbar 04/23/2017   Hypertension    No longer on medication   Prediabetes    Syncope 10/20/2015   Tobacco abuse    quit 2022    Past Surgical History:  Procedure Laterality Date   LUMBAR LAMINECTOMY/DECOMPRESSION MICRODISCECTOMY Bilateral 04/23/2017   Procedure: L4-5 microdiscectomy;  Surgeon: Kary Kos, MD;  Location: [facility];  Service: Neurosurgery;  Laterality: Bilateral;    Family History  Problem Relation Age of Onset   Hypertension Mother    Diabetes Mother    Cancer Father        Prostate    Colon cancer Father    Heart disease Brother        41 brother, MI in late 37s   Esophageal cancer Neg Hx    Rectal cancer Neg Hx    Stomach cancer Neg Hx     Social History   Socioeconomic History   Marital status: Single    Spouse name: Not on file   Number of children: Not on file   Years of education: Not on file   Highest education level: Not on file  Occupational History   Occupation: retired  Tobacco Use   Smoking status:  Former    Packs/day: 0.10    Years: 40.00    Total pack years: 4.00    Types: Cigarettes    Quit date: 2022    Years since quitting: 1.7   Smokeless tobacco: Never  Vaping Use   Vaping Use: Never used  Substance and Sexual Activity   Alcohol use: Not Currently    Alcohol/week: 0.0 standard drinks of alcohol    Comment: quit june 2018   Drug use: No   Sexual activity: Yes    Partners: Female  Other Topics Concern   Not on file  Social History Narrative   Lives in [place]   6/21- has not talked to family about advanced directives. Would want CPR/ intubation/ artificial nutrition if chance to return to normal function. Has printed information at home regarding HCPOA/ Living Will.    Social Determinants of Health   Financial Resource Strain: Not on file  Food Insecurity: Not on file  Transportation Needs: Not on file  Physical Activity: Not on file  Stress: Not on file  Social Connections: Not on file  Intimate Partner Violence: Not on file    Outpatient Medications Prior to Visit  Medication Sig Dispense Refill  atorvastatin (LIPITOR) 20 MG tablet Take 1 tablet (20 mg total) by mouth daily. 90 tablet 1   EPINEPHrine 0.3 mg/0.3 mL IJ SOAJ injection INJECT 1 PEN INTO THE MUSCLE AS DIRECTED FOR SEVERE ALLERGIC REACTION, THEN GO TO ER 1 each 1   No facility-administered medications prior to visit.    Allergies  Allergen Reactions   Bee Venom Anaphylaxis and Hives    Has epi pen         Objective:    Physical Exam Constitutional:      Appearance: Normal appearance.  Pulmonary:     Effort: Pulmonary effort is normal.  Skin:    Findings: Rash (papular rash with some raised scaly texture , possible annular presenation at left lower base. localized to left lower anterior shin) present.  Neurological:     Mental Status: He is alert.       BP 110/78   Pulse 60   Temp 97.6 F (36.4 C) (Temporal)   Ht '5\' 11"'$  (1.803 m)   Wt 208 lb (94.3 kg)   SpO2 99%   BMI  29.01 kg/m  Wt Readings from Last 3 Encounters:  01/18/22 208 lb (94.3 kg)  08/16/21 207 lb 3 oz (94 kg)  05/17/21 212 lb (96.2 kg)     Health Maintenance Due  Topic Date Due   COLONOSCOPY (Pts 45-24yr Insurance coverage will need to be confirmed)  05/27/2021    There are no preventive care reminders to display for this patient.  No results found for: "TSH" Lab Results  Component Value Date   WBC 9.9 05/17/2021   HGB 13.8 05/17/2021   HCT 42.0 05/17/2021   MCV 85.3 05/17/2021   PLT 313.0 05/17/2021   Lab Results  Component Value Date   NA 142 05/17/2021   K 4.5 05/17/2021   CO2 28 05/17/2021   GLUCOSE 107 (H) 05/17/2021   BUN 18 05/17/2021   CREATININE 1.22 05/17/2021   BILITOT 0.4 08/16/2021   ALKPHOS 69 08/16/2021   AST 18 08/16/2021   ALT 27 08/16/2021   PROT 7.4 08/16/2021   ALBUMIN 4.3 08/16/2021   CALCIUM 9.2 05/17/2021   ANIONGAP 9 04/23/2017   GFR 60.65 05/17/2021   Lab Results  Component Value Date   HGBA1C 5.9 (A) 08/16/2021      Assessment & Plan:   Problem List Items Addressed This Visit       Musculoskeletal and Integument   Ringworm of body - Primary    Will send in prescription for Lotrisone 1-.05% for 14 days since it did not completely go away after 7 day treatment last time.   Recommend dermatology referral, if not better.       Relevant Medications   clotrimazole-betamethasone (LOTRISONE) cream     Other   Allergy to bee sting    Continue with epi pen on person at all times, use prn.   I evaluated the patient,  was consulted regarding plans for treatment of care, and agree with the assessment and plan per BTinnie Gens RN, DNP student.   TEugenia Pancoast FNP-C        Meds ordered this encounter  Medications   clotrimazole-betamethasone (LOTRISONE) cream    Sig: Apply 1 Application topically daily.    Dispense:  30 g    Refill:  1    Order Specific Question:   Supervising Provider    Answer:   BEDSOLE, AMY E [[id number]]     Follow-up: Return in about 3 months (around  04/19/2022) for f/u cholesterol .    Eugenia Pancoast, FNP

## 2022-01-18 NOTE — Progress Notes (Signed)
Established Patient Office Visit  Subjective   Patient ID: Patrick Thompson, male    DOB: 1951-02-07  Age: 71 y.o. MRN: [id number]  Chief Complaint  Patient presents with   Rash    Was treated before for ringworm in same spot started coming back about a month ago.     HPI Patrick Thompson is a 71 year old male who present today for an acute visit. He has a history of ringworm and was treated in April. It presented on bilateral legs and left knee. With treatment, it went away about 90% and remained itchy and dry on left lateral leg. He noticed about a month ago, it gradually started itching and becoming red again. He tried OTC hydrocortisone and it stopped the itching. Reports no fever. He has not noticed it anywhere else on the body.   Patient Active Problem List   Diagnosis Date Noted   Ringworm of body 08/16/2021   On statin therapy 08/16/2021   Allergy to bee sting 05/17/2021   Prediabetes 05/17/2021   Mixed hyperlipidemia 05/17/2021   Past Medical History:  Diagnosis Date   Allergy    anaphylaxis with bee sting   BPH (benign prostatic hyperplasia)    2018, pt does not recollect this   Elevated blood pressure reading 05/17/2021   Elevated at visit 1/11 D/w pt blood pressure record and f/u in three weeks   HNP (herniated nucleus pulposus), lumbar 04/23/2017   Hypertension    No longer on medication   Prediabetes    Syncope 10/20/2015   Tobacco abuse    quit 2022   Past Surgical History:  Procedure Laterality Date   LUMBAR LAMINECTOMY/DECOMPRESSION MICRODISCECTOMY Bilateral 04/23/2017   Procedure: L4-5 microdiscectomy;  Surgeon: Kary Kos, MD;  Location: [facility];  Service: Neurosurgery;  Laterality: Bilateral;   Social History   Tobacco Use   Smoking status: Former    Packs/day: 0.10    Years: 40.00    Total pack years: 4.00    Types: Cigarettes    Quit date: 2022    Years since quitting: 1.7   Smokeless tobacco: Never  Vaping Use   Vaping Use: Never used   Substance Use Topics   Alcohol use: Not Currently    Alcohol/week: 0.0 standard drinks of alcohol    Comment: quit june 2018   Drug use: No   Family Status  Relation Name Status   Mother  Deceased   Father  Deceased   Brother  (Not Specified)   Neg Hx  (Not Specified)   Family History  Problem Relation Age of Onset   Hypertension Mother    Diabetes Mother    Cancer Father        Prostate    Colon cancer Father    Heart disease Brother        74 brother, MI in late 70s   Esophageal cancer Neg Hx    Rectal cancer Neg Hx    Stomach cancer Neg Hx    Allergies  Allergen Reactions   Bee Venom Anaphylaxis and Hives    Has epi pen       ROS    Objective:     BP 110/78   Pulse 60   Temp 97.6 F (36.4 C) (Temporal)   Ht '5\' 11"'$  (1.803 m)   Wt 208 lb (94.3 kg)   SpO2 99%   BMI 29.01 kg/m  BP Readings from Last 3 Encounters:  01/18/22 110/78  08/16/21 138/82  05/17/21 Marland Kitchen)  146/84   Wt Readings from Last 3 Encounters:  01/18/22 208 lb (94.3 kg)  08/16/21 207 lb 3 oz (94 kg)  05/17/21 212 lb (96.2 kg)      Physical Exam Vitals and nursing note reviewed.  Constitutional:      Appearance: Normal appearance.  Cardiovascular:     Rate and Rhythm: Normal rate and regular rhythm.     Pulses: Normal pulses.     Heart sounds: Normal heart sounds.  Pulmonary:     Effort: Pulmonary effort is normal.     Breath sounds: Normal breath sounds.  Skin:    Findings: Rash present. Rash is scaling.     Comments: Left shin: rash present, asymmetrical, Scaly, annular, lesion  Neurological:     Mental Status: He is alert and oriented to person, place, and time.      No results found for any visits on 01/18/22.     The ASCVD Risk score (Arnett DK, et al., 2019) failed to calculate for the following reasons:   The valid total cholesterol range is 130 to 320 mg/dL    Assessment & Plan:   Problem List Items Addressed This Visit       Musculoskeletal and  Integument   Ringworm of body - Primary    Will send in prescription for Lotrisone 1-.05% for 14 days since it did not completely go away after 7 day treatment last time.   Recommend dermatology referral, if not better.       Relevant Medications   clotrimazole-betamethasone (LOTRISONE) cream     Other   Allergy to bee sting    Continue with epi pen on person at all times, use prn.   I evaluated the patient,  was consulted regarding plans for treatment of care, and agree with the assessment and plan per Tinnie Gens, RN, DNP student.   Eugenia Pancoast, FNP-C        Return in about 3 months (around 04/19/2022) for f/u cholesterol .   Tinnie Gens, BSN-RN, DNP STUDENT

## 2022-01-18 NOTE — Assessment & Plan Note (Addendum)
Will send in prescription for Lotrisone 1-.05% for 14 days since it did not completely go away after 7 day treatment last time.   Recommend dermatology referral, if not better.

## 2022-01-18 NOTE — Assessment & Plan Note (Addendum)
Continue with epi pen on person at all times, use prn.   I evaluated the patient,  was consulted regarding plans for treatment of care, and agree with the assessment and plan per Tinnie Gens, RN, DNP student.   Eugenia Pancoast, FNP-C

## 2022-01-18 NOTE — Patient Instructions (Signed)
Continue to monitor if no improvement we can consider a dermatology referral.  Due to recent changes in healthcare laws, you may see results of your imaging and/or laboratory studies on MyChart before I have had a chance to review them.  I understand that in some cases there may be results that are confusing or concerning to you. Please understand that not all results are received at the same time and often I may need to interpret multiple results in order to provide you with the best plan of care or course of treatment. Therefore, I ask that you please give me 2 business days to thoroughly review all your results before contacting my office for clarification. Should we see a critical lab result, you will be contacted sooner.   It was a pleasure seeing you today! Please do not hesitate to reach out with any questions and or concerns.  Regards,   Eugenia Pancoast FNP-C

## 2022-02-08 ENCOUNTER — Other Ambulatory Visit: Payer: Self-pay

## 2022-02-08 DIAGNOSIS — E782 Mixed hyperlipidemia: Secondary | ICD-10-CM

## 2022-02-08 MED ORDER — ATORVASTATIN CALCIUM 20 MG PO TABS: 20.0000 mg | ORAL_TABLET | Freq: Every day | ORAL | 1 refills | Status: DC

## 2022-02-14 ENCOUNTER — Telehealth: Payer: Self-pay | Admitting: Family

## 2022-02-14 NOTE — Telephone Encounter (Signed)
Left message for patient to call back and schedule Medicare Annual Wellness Visit (AWV).   Please offer to do virtually or by telephone.   Last AWV: 10/15/2018  Please schedule at anytime with [facility] Advisor schedule   45 minute appointent  If any questions, please contact me at [phone]

## 2022-04-04 ENCOUNTER — Ambulatory Visit (INDEPENDENT_AMBULATORY_CARE_PROVIDER_SITE_OTHER): Payer: Medicare HMO

## 2022-04-04 VITALS — Ht 71.0 in | Wt 208.0 lb

## 2022-04-04 DIAGNOSIS — Z0000 Encounter for general adult medical examination without abnormal findings: Secondary | ICD-10-CM | POA: Diagnosis not present

## 2022-04-04 NOTE — Progress Notes (Signed)
Subjective:   Patrick Thompson is a 71 y.o. male who presents for Medicare Annual/Subsequent preventive examination.  Review of Systems    No ROS.  Medicare Wellness Virtual Visit.  Visual/audio telehealth visit, UTA vital signs.   See social history for additional risk factors.   Cardiac Risk Factors include: advanced age (>84mn, >>21women);male gender     Objective:    Today's Vitals   04/04/22 1034  Weight: 208 lb (94.3 kg)  Height: '5\' 11"'$  (1.803 m)   Body mass index is 29.01 kg/m.     04/04/2022   10:38 AM 01/02/2018    3:16 PM 04/23/2017    8:34 AM 04/12/2017   11:32 AM 03/30/2016    6:03 PM 10/19/2015    6:17 PM  Advanced Directives  Does Patient Have a Medical Advance Directive? No No No No No No  Would patient like information on creating a medical advance directive? No - Patient declined No - Patient declined        Current Medications (verified) Outpatient Encounter Medications as of 04/04/2022  Medication Sig   atorvastatin (LIPITOR) 20 MG tablet Take 1 tablet (20 mg total) by mouth daily.   clotrimazole-betamethasone (LOTRISONE) cream Apply 1 Application topically daily.   EPINEPHrine 0.3 mg/0.3 mL IJ SOAJ injection INJECT 1 PEN INTO THE MUSCLE AS DIRECTED FOR SEVERE ALLERGIC REACTION, THEN GO TO ER   No facility-administered encounter medications on file as of 04/04/2022.    Allergies (verified) Bee venom   History: Past Medical History:  Diagnosis Date   Allergy    anaphylaxis with bee sting   BPH (benign prostatic hyperplasia)    2018, pt does not recollect this   Elevated blood pressure reading 05/17/2021   Elevated at visit 1/11 D/w pt blood pressure record and f/u in three weeks   HNP (herniated nucleus pulposus), lumbar 04/23/2017   Hypertension    No longer on medication   Prediabetes    Syncope 10/20/2015   Tobacco abuse    quit 2022   Past Surgical History:  Procedure Laterality Date   LUMBAR LAMINECTOMY/DECOMPRESSION  MICRODISCECTOMY Bilateral 04/23/2017   Procedure: L4-5 microdiscectomy;  Surgeon: CKary Kos MD;  Location: [facility]  Service: Neurosurgery;  Laterality: Bilateral;   Family History  Problem Relation Age of Onset   Hypertension Mother    Diabetes Mother    Cancer Father        Prostate    Colon cancer Father    Heart disease Brother        H24brother, MI in late 543s  Esophageal cancer Neg Hx    Rectal cancer Neg Hx    Stomach cancer Neg Hx    Social History   Socioeconomic History   Marital status: Single    Spouse name: Not on file   Number of children: Not on file   Years of education: Not on file   Highest education level: Not on file  Occupational History   Occupation: retired  Tobacco Use   Smoking status: Former    Packs/day: 0.10    Years: 40.00    Total pack years: 4.00    Types: Cigarettes    Quit date: 2022    Years since quitting: 1.9   Smokeless tobacco: Never  Vaping Use   Vaping Use: Never used  Substance and Sexual Activity   Alcohol use: Not Currently    Alcohol/week: 0.0 standard drinks of alcohol    Comment: quit june 2018  Drug use: No   Sexual activity: Yes    Partners: Female  Other Topics Concern   Not on file  Social History Narrative   Lives in [place]   6/21- has not talked to family about advanced directives. Would want CPR/ intubation/ artificial nutrition if chance to return to normal function. Has printed information at home regarding HCPOA/ Living Will.    Social Determinants of Health   Financial Resource Strain: Low Risk  (04/04/2022)   Overall Financial Resource Strain (CARDIA)    Difficulty of Paying Living Expenses: Not hard at all  Food Insecurity: No Food Insecurity (04/04/2022)   Hunger Vital Sign    Worried About Running Out of Food in the Last Year: Never true    Ran Out of Food in the Last Year: Never true  Transportation Needs: No Transportation Needs (04/04/2022)   PRAPARE - Radiographer, therapeutic (Medical): No    Lack of Transportation (Non-Medical): No  Physical Activity: Sufficiently Active (04/04/2022)   Exercise Vital Sign    Days of Exercise per Week: 7 days    Minutes of Exercise per Session: 30 min  Stress: No Stress Concern Present (04/04/2022)   [place]    Feeling of Stress : Not at all  Social Connections: Unknown (04/04/2022)   Social Connection and Isolation Panel [NHANES]    Frequency of Communication with Friends and Family: More than three times a week    Frequency of Social Gatherings with Friends and Family: More than three times a week    Attends Religious Services: Not on Advertising copywriter or Organizations: Not on file    Attends Archivist Meetings: Not on file    Marital Status: Not on file    Tobacco Counseling Counseling given: Not Answered   Clinical Intake:  Pre-visit preparation completed: Yes        Diabetes: No  How often do you need to have someone help you when you read instructions, pamphlets, or other written materials from your doctor or pharmacy?: 1 - Never   Interpreter Needed?: No    Activities of Daily Living    04/04/2022   10:37 AM 05/17/2021    8:29 AM  In your present state of health, do you have any difficulty performing the following activities:  Hearing? 0 1  Vision? 0 0  Difficulty concentrating or making decisions? 0 0  Walking or climbing stairs? 0 0  Dressing or bathing? 0 0  Doing errands, shopping? 0 0  Preparing Food and eating ? N   Using the Toilet? N   In the past six months, have you accidently leaked urine? N   Do you have problems with loss of bowel control? N   Managing your Medications? N   Managing your Finances? N   Housekeeping or managing your Housekeeping? N     Patient Care Team: Eugenia Pancoast, FNP as PCP - General (Family Medicine)  Indicate any recent Medical Services you may  have received from other than [facility] providers in the past year (date may be approximate).     Assessment:   This is a routine wellness examination for [name].  I connected with  Di Kindle on 04/04/22 by a audio enabled telemedicine application and verified that I am speaking with the correct person using two identifiers.  Patient Location: Home  Provider Location: Office/Clinic  I discussed the limitations of  evaluation and management by telemedicine. The patient expressed understanding and agreed to proceed.   Hearing/Vision screen Hearing Screening - Comments:: Patient is able to hear conversational tones without difficulty.  No issues reported.   Vision Screening - Comments:: Followed by [facility] Wears corrective lenses They have seen their ophthalmologist in the last 12 months.    Dietary issues and exercise activities discussed: Current Exercise Habits: Home exercise routine, Type of exercise: walking, Time (Minutes): 30, Frequency (Times/Week): 7, Weekly Exercise (Minutes/Week): 210, Intensity: Mild   Goals Addressed               This Visit's Progress     Patient Stated     Maintain healthy lifestyle (pt-stated)        Stay active Healthy diet Good water intake       Depression Screen    04/04/2022   10:37 AM 05/17/2021    8:29 AM 10/12/2019   10:52 AM 10/15/2018    9:31 AM 06/28/2017    3:22 PM  PHQ 2/9 Scores  PHQ - 2 Score 0 0 0 0 0  PHQ- 9 Score    0     Fall Risk    04/04/2022   10:37 AM 05/17/2021    8:29 AM 10/12/2019   10:52 AM 10/15/2018    9:31 AM 06/28/2017    3:22 PM  Fall Risk   Falls in the past year? 0 0 0 0 No  Number falls in past yr: 0 0  0   Injury with Fall? 0 0  0   Risk for fall due to : No Fall Risks      Follow up Falls evaluation completed;Education provided        FALL RISK PREVENTION PERTAINING TO THE HOME: Home free of loose throw rugs in walkways, pet beds, electrical cords, etc? Yes  Adequate  lighting in your home to reduce risk of falls? Yes   ASSISTIVE DEVICES UTILIZED TO PREVENT FALLS: Life alert? No  Use of a cane, walker or w/c? No   TIMED UP AND GO: Was the test performed? No .   Cognitive Function:        04/04/2022   11:03 AM  6CIT Screen  What Year? 0 points  What month? 0 points  What time? 0 points  Count back from 20 0 points  Months in reverse 0 points  Repeat phrase 0 points  Total Score 0 points    Immunizations Immunization History  Administered Date(s) Administered   Pneumococcal Conjugate-13 10/30/2018   Screening Tests Health Maintenance  Topic Date Due   COLONOSCOPY (Pts 45-5yr Insurance coverage will need to be confirmed)  05/05/2022 (Originally 05/27/2021)   Pneumonia Vaccine 70 Years old (2 - PPSV23 or PCV20) 05/17/2022 (Originally 10/30/2019)   INFLUENZA VACCINE  08/05/2022 (Originally 12/05/2021)   Medicare Annual Wellness (AWV)  04/05/2023   Hepatitis C Screening  Completed   HPV VACCINES  Aged Out   COVID-19 Vaccine  Discontinued   Zoster Vaccines- Shingrix  Discontinued   Health Maintenance There are no preventive care reminders to display for this patient.  Colonoscopy- deferred ordering at this time per patient.   Lung Cancer Screening: (Low Dose CT Chest recommended if Age 70-80years, 30 pack-year currently smoking OR have quit w/in 15years.) does not qualify.   Hepatitis C Screening: Completed 2020.  Vision Screening: Recommended annual ophthalmology exams for early detection of glaucoma and other disorders of the eye.  Dental Screening: Recommended  annual dental exams for proper oral hygiene.  Community Resource Referral / Chronic Care Management: CRR required this visit?  No   CCM required this visit?  No      Plan:     I have personally reviewed and noted the following in the patient's chart:   Medical and social history Use of alcohol, tobacco or illicit drugs  Current medications and supplements  including opioid prescriptions. Patient is not currently taking opioid prescriptions. Functional ability and status Nutritional status Physical activity Advanced directives List of other physicians Hospitalizations, surgeries, and ER visits in previous 12 months Vitals Screenings to include cognitive, depression, and falls Referrals and appointments  In addition, I have reviewed and discussed with patient certain preventive protocols, quality metrics, and best practice recommendations. A written personalized care plan for preventive services as well as general preventive health recommendations were provided to patient.     Leta Jungling, LPN   [id number]

## 2022-04-04 NOTE — Patient Instructions (Addendum)
Patrick Thompson , Thank you for taking time to come for your Medicare Wellness Visit. I appreciate your ongoing commitment to your health goals. Please review the following plan we discussed and let me know if I can assist you in the future.   These are the goals we discussed:  Goals       Patient Stated     Maintain healthy lifestyle (pt-stated)      Stay active Healthy diet Good water intake        This is a list of the screening recommended for you and due dates:  Health Maintenance  Topic Date Due   Colon Cancer Screening  05/05/2022*   Pneumonia Vaccine (2 - PPSV23 or PCV20) 05/17/2022*   Flu Shot  08/05/2022*   Medicare Annual Wellness Visit  04/05/2023   Hepatitis C Screening: USPSTF Recommendation to screen - Ages 18-79 yo.  Completed   HPV Vaccine  Aged Out   COVID-19 Vaccine  Discontinued   Zoster (Shingles) Vaccine  Discontinued  *Topic was postponed. The date shown is not the original due date.    Advanced directives: End of life planning; Advanced aging; Advanced directives discussed.  No HCPOA/Living Will.  Additional information available in office.   Conditions/risks identified: none new  Next appointment: Follow up in one year for your annual wellness visit.   Preventive Care 26 Years and Older, Male  Preventive care refers to lifestyle choices and visits with your health care provider that can promote health and wellness. What does preventive care include? A yearly physical exam. This is also called an annual well check. Dental exams once or twice a year. Routine eye exams. Ask your health care provider how often you should have your eyes checked. Personal lifestyle choices, including: Daily care of your teeth and gums. Regular physical activity. Eating a healthy diet. Avoiding tobacco and drug use. Limiting alcohol use. Practicing safe sex. Taking low doses of aspirin every day. Taking vitamin and mineral supplements as recommended by your health care  provider. What happens during an annual well check? The services and screenings done by your health care provider during your annual well check will depend on your age, overall health, lifestyle risk factors, and family history of disease. Counseling  Your health care provider may ask you questions about your: Alcohol use. Tobacco use. Drug use. Emotional well-being. Home and relationship well-being. Sexual activity. Eating habits. History of falls. Memory and ability to understand (cognition). Work and work Statistician. Screening  You may have the following tests or measurements: Height, weight, and BMI. Blood pressure. Lipid and cholesterol levels. These may be checked every 5 years, or more frequently if you are over 7 years old. Skin check. Lung cancer screening. You may have this screening every year starting at age 5 if you have a 30-pack-year history of smoking and currently smoke or have quit within the past 15 years. Fecal occult blood test (FOBT) of the stool. You may have this test every year starting at age 87. Flexible sigmoidoscopy or colonoscopy. You may have a sigmoidoscopy every 5 years or a colonoscopy every 10 years starting at age 63. Prostate cancer screening. Recommendations will vary depending on your family history and other risks. Hepatitis C blood test. Hepatitis B blood test. Sexually transmitted disease (STD) testing. Diabetes screening. This is done by checking your blood sugar (glucose) after you have not eaten for a while (fasting). You may have this done every 1-3 years. Abdominal aortic aneurysm (AAA) screening. You  may need this if you are a current or former smoker. Osteoporosis. You may be screened starting at age 59 if you are at high risk. Talk with your health care provider about your test results, treatment options, and if necessary, the need for more tests. Vaccines  Your health care provider may recommend certain vaccines, such  as: Influenza vaccine. This is recommended every year. Tetanus, diphtheria, and acellular pertussis (Tdap, Td) vaccine. You may need a Td booster every 10 years. Zoster vaccine. You may need this after age 60. Pneumococcal 13-valent conjugate (PCV13) vaccine. One dose is recommended after age 96. Pneumococcal polysaccharide (PPSV23) vaccine. One dose is recommended after age 53. Talk to your health care provider about which screenings and vaccines you need and how often you need them. This information is not intended to replace advice given to you by your health care provider. Make sure you discuss any questions you have with your health care provider. Document Released: 05/20/2015 Document Revised: 01/11/2016 Document Reviewed: 02/22/2015 Elsevier Interactive Patient Education  2017 [place] Prevention in the Home Falls can cause injuries. They can happen to people of all ages. There are many things you can do to make your home safe and to help prevent falls. What can I do on the outside of my home? Regularly fix the edges of walkways and driveways and fix any cracks. Remove anything that might make you trip as you walk through a door, such as a raised step or threshold. Trim any bushes or trees on the path to your home. Use bright outdoor lighting. Clear any walking paths of anything that might make someone trip, such as rocks or tools. Regularly check to see if handrails are loose or broken. Make sure that both sides of any steps have handrails. Any raised decks and porches should have guardrails on the edges. Have any leaves, snow, or ice cleared regularly. Use sand or salt on walking paths during winter. Clean up any spills in your garage right away. This includes oil or grease spills. What can I do in the bathroom? Use night lights. Install grab bars by the toilet and in the tub and shower. Do not use towel bars as grab bars. Use non-skid mats or decals in the tub or  shower. If you need to sit down in the shower, use a plastic, non-slip stool. Keep the floor dry. Clean up any water that spills on the floor as soon as it happens. Remove soap buildup in the tub or shower regularly. Attach bath mats securely with double-sided non-slip rug tape. Do not have throw rugs and other things on the floor that can make you trip. What can I do in the bedroom? Use night lights. Make sure that you have a light by your bed that is easy to reach. Do not use any sheets or blankets that are too big for your bed. They should not hang down onto the floor. Have a firm chair that has side arms. You can use this for support while you get dressed. Do not have throw rugs and other things on the floor that can make you trip. What can I do in the kitchen? Clean up any spills right away. Avoid walking on wet floors. Keep items that you use a lot in easy-to-reach places. If you need to reach something above you, use a strong step stool that has a grab bar. Keep electrical cords out of the way. Do not use floor polish or wax that  makes floors slippery. If you must use wax, use non-skid floor wax. Do not have throw rugs and other things on the floor that can make you trip. What can I do with my stairs? Do not leave any items on the stairs. Make sure that there are handrails on both sides of the stairs and use them. Fix handrails that are broken or loose. Make sure that handrails are as long as the stairways. Check any carpeting to make sure that it is firmly attached to the stairs. Fix any carpet that is loose or worn. Avoid having throw rugs at the top or bottom of the stairs. If you do have throw rugs, attach them to the floor with carpet tape. Make sure that you have a light switch at the top of the stairs and the bottom of the stairs. If you do not have them, ask someone to add them for you. What else can I do to help prevent falls? Wear shoes that: Do not have high heels. Have  rubber bottoms. Are comfortable and fit you well. Are closed at the toe. Do not wear sandals. If you use a stepladder: Make sure that it is fully opened. Do not climb a closed stepladder. Make sure that both sides of the stepladder are locked into place. Ask someone to hold it for you, if possible. Clearly mark and make sure that you can see: Any grab bars or handrails. First and last steps. Where the edge of each step is. Use tools that help you move around (mobility aids) if they are needed. These include: Canes. Walkers. Scooters. Crutches. Turn on the lights when you go into a dark area. Replace any light bulbs as soon as they burn out. Set up your furniture so you have a clear path. Avoid moving your furniture around. If any of your floors are uneven, fix them. If there are any pets around you, be aware of where they are. Review your medicines with your doctor. Some medicines can make you feel dizzy. This can increase your chance of falling. Ask your doctor what other things that you can do to help prevent falls. This information is not intended to replace advice given to you by your health care provider. Make sure you discuss any questions you have with your health care provider. Document Released: 02/17/2009 Document Revised: 09/29/2015 Document Reviewed: 05/28/2014 Elsevier Interactive Patient Education  2017 [employer].

## 2022-05-17 ENCOUNTER — Encounter: Payer: Self-pay | Admitting: Family

## 2022-05-17 ENCOUNTER — Ambulatory Visit (INDEPENDENT_AMBULATORY_CARE_PROVIDER_SITE_OTHER): Payer: Medicare HMO | Admitting: Family

## 2022-05-17 VITALS — BP 124/60 | HR 78 | Temp 98.6°F | Ht 71.0 in | Wt 201.0 lb

## 2022-05-17 DIAGNOSIS — R21 Rash and other nonspecific skin eruption: Secondary | ICD-10-CM | POA: Diagnosis not present

## 2022-05-17 DIAGNOSIS — L237 Allergic contact dermatitis due to plants, except food: Secondary | ICD-10-CM | POA: Diagnosis not present

## 2022-05-17 DIAGNOSIS — B354 Tinea corporis: Secondary | ICD-10-CM

## 2022-05-17 MED ORDER — TRIAMCINOLONE ACETONIDE 0.1 % EX CREA: 1.0000 | TOPICAL_CREAM | Freq: Two times a day (BID) | CUTANEOUS | 0 refills | Status: DC

## 2022-05-17 MED ORDER — PREDNISONE 20 MG PO TABS: ORAL_TABLET | ORAL | 0 refills | Status: DC

## 2022-05-17 NOTE — Assessment & Plan Note (Signed)
Rx Triamcinolone cream 0.1% Prednisone 20 mg taper sent to pharmacy  Pt advised to:   Please monitor site for worsening signs/symptoms of infection to include: increasing redness, increasing tenderness, increase in size, and or pustulant drainage from site. If this is to occur please let me know immediately.

## 2022-05-17 NOTE — Progress Notes (Signed)
Established Patient Office Visit  Subjective:  Patient ID: Patrick Thompson, male    DOB: [birth year]  Age: 72 y.o. MRN: [id number]  CC:  Chief Complaint  Patient presents with   Annual Exam    Fasting    Poison Ivy    B/l arms and back over a month otc meds not helping much.     HPI Patrick Thompson is here today with concerns.   Started with poison ivy has been going on for about one month. Has been applying hydrocortisone cream with only mild relief. On bil upper arms. Also has spots on his back as well on lower back.   Past Medical History:  Diagnosis Date   Allergy    anaphylaxis with bee sting   BPH (benign prostatic hyperplasia)    2018, pt does not recollect this   Elevated blood pressure reading 05/17/2021   Elevated at visit 1/11 D/w pt blood pressure record and f/u in three weeks   HNP (herniated nucleus pulposus), lumbar 04/23/2017   Hypertension    No longer on medication   Prediabetes    Syncope 10/20/2015   Tobacco abuse    quit 2022    Past Surgical History:  Procedure Laterality Date   LUMBAR LAMINECTOMY/DECOMPRESSION MICRODISCECTOMY Bilateral 04/23/2017   Procedure: L4-5 microdiscectomy;  Surgeon: Kary Kos, MD;  Location: [facility];  Service: Neurosurgery;  Laterality: Bilateral;    Family History  Problem Relation Age of Onset   Hypertension Mother    Diabetes Mother    Cancer Father        Prostate    Colon cancer Father    Heart disease Brother        21 brother, MI in late 18s   Esophageal cancer Neg Hx    Rectal cancer Neg Hx    Stomach cancer Neg Hx     Social History   Socioeconomic History   Marital status: Single    Spouse name: Not on file   Number of children: Not on file   Years of education: Not on file   Highest education level: Not on file  Occupational History   Occupation: retired  Tobacco Use   Smoking status: Former    Packs/day: 0.10    Years: 40.00    Total pack years: 4.00    Types: Cigarettes    Quit date:  2022    Years since quitting: 2.0   Smokeless tobacco: Never  Vaping Use   Vaping Use: Never used  Substance and Sexual Activity   Alcohol use: Not Currently    Alcohol/week: 0.0 standard drinks of alcohol    Comment: quit june 2018   Drug use: No   Sexual activity: Yes    Partners: Female  Other Topics Concern   Not on file  Social History Narrative   Lives in [place]   6/21- has not talked to family about advanced directives. Would want CPR/ intubation/ artificial nutrition if chance to return to normal function. Has printed information at home regarding HCPOA/ Living Will.    Social Determinants of Health   Financial Resource Strain: Low Risk  (04/04/2022)   Overall Financial Resource Strain (CARDIA)    Difficulty of Paying Living Expenses: Not hard at all  Food Insecurity: No Food Insecurity (04/04/2022)   Hunger Vital Sign    Worried About Running Out of Food in the Last Year: Never true    Ran Out of Food in the Last Year: Never true  Transportation Needs:  No Transportation Needs (04/04/2022)   PRAPARE - Hydrologist (Medical): No    Lack of Transportation (Non-Medical): No  Physical Activity: Sufficiently Active (04/04/2022)   Exercise Vital Sign    Days of Exercise per Week: 7 days    Minutes of Exercise per Session: 30 min  Stress: No Stress Concern Present (04/04/2022)   [place]    Feeling of Stress : Not at all  Social Connections: Unknown (04/04/2022)   Social Connection and Isolation Panel [NHANES]    Frequency of Communication with Friends and Family: More than three times a week    Frequency of Social Gatherings with Friends and Family: More than three times a week    Attends Religious Services: Not on file    Active Member of [employer] or Organizations: Not on file    Attends Archivist Meetings: Not on file    Marital Status: Not on file  Intimate  Partner Violence: Not At Risk (04/04/2022)   Humiliation, Afraid, Rape, and Kick questionnaire    Fear of Current or Ex-Partner: No    Emotionally Abused: No    Physically Abused: No    Sexually Abused: No    Outpatient Medications Prior to Visit  Medication Sig Dispense Refill   atorvastatin (LIPITOR) 20 MG tablet Take 1 tablet (20 mg total) by mouth daily. 90 tablet 1   clotrimazole-betamethasone (LOTRISONE) cream Apply 1 Application topically daily. 30 g 1   EPINEPHrine 0.3 mg/0.3 mL IJ SOAJ injection INJECT 1 PEN INTO THE MUSCLE AS DIRECTED FOR SEVERE ALLERGIC REACTION, THEN GO TO ER 1 each 1   No facility-administered medications prior to visit.    Allergies  Allergen Reactions   Bee Venom Anaphylaxis and Hives    Has epi pen         Objective:    Physical Exam Vitals reviewed.  Constitutional:      Appearance: Normal appearance. He is normal weight.  Cardiovascular:     Rate and Rhythm: Regular rhythm.  Skin:    Findings: Rash present. Rash is papular.     Comments: Scatter papular rash bil lower back with erythema , rash also on anterior bil forearms and bil lower shins . Appears to be petichiae from scratching on bil le  Neurological:     Mental Status: He is alert.           BP 124/60   Pulse 78   Temp 98.6 F (37 C) (Oral)   Ht '5\' 11"'$  (1.803 m)   Wt 201 lb (91.2 kg)   SpO2 98%   BMI 28.03 kg/m  Wt Readings from Last 3 Encounters:  05/17/22 201 lb (91.2 kg)  04/04/22 208 lb (94.3 kg)  01/18/22 208 lb (94.3 kg)     Health Maintenance Due  Topic Date Due   DTaP/Tdap/Td (1 - Tdap) Never done   COLONOSCOPY (Pts 45-57yr Insurance coverage will need to be confirmed)  05/27/2021    There are no preventive care reminders to display for this patient.  No results found for: "TSH" Lab Results  Component Value Date   WBC 9.9 05/17/2021   HGB 13.8 05/17/2021   HCT 42.0 05/17/2021   MCV 85.3 05/17/2021   PLT 313.0 05/17/2021   Lab Results   Component Value Date   NA 142 05/17/2021   K 4.5 05/17/2021   CO2 28 05/17/2021   GLUCOSE 107 (H) 05/17/2021  BUN 18 05/17/2021   CREATININE 1.22 05/17/2021   BILITOT 0.4 08/16/2021   ALKPHOS 69 08/16/2021   AST 18 08/16/2021   ALT 27 08/16/2021   PROT 7.4 08/16/2021   ALBUMIN 4.3 08/16/2021   CALCIUM 9.2 05/17/2021   ANIONGAP 9 04/23/2017   GFR 60.65 05/17/2021   Lab Results  Component Value Date   HGBA1C 5.9 (A) 08/16/2021      Assessment & Plan:   Problem List Items Addressed This Visit       Musculoskeletal and Integument   Rash and nonspecific skin eruption   Relevant Orders   Ambulatory referral to Dermatology   Allergic dermatitis due to poison ivy - Primary    Rx Triamcinolone cream 0.1% Prednisone 20 mg taper sent to pharmacy  Pt advised to:   Please monitor site for worsening signs/symptoms of infection to include: increasing redness, increasing tenderness, increase in size, and or pustulant drainage from site. If this is to occur please let me know immediately.        Relevant Medications   predniSONE (DELTASONE) 20 MG tablet   triamcinolone cream (KENALOG) 0.1 %   Other Relevant Orders   Ambulatory referral to Dermatology   RESOLVED: Ringworm of body    Meds ordered this encounter  Medications   predniSONE (DELTASONE) 20 MG tablet    Sig: Take two tablets po qd for five days, one tablet po qd for five days, then 1/2 tablet po qd for five days    Dispense:  18 tablet    Refill:  0    Order Specific Question:   Supervising Provider    Answer:   BEDSOLE, AMY E [[id number]]   triamcinolone cream (KENALOG) 0.1 %    Sig: Apply 1 Application topically 2 (two) times daily.    Dispense:  30 g    Refill:  0    Order Specific Question:   Supervising Provider    Answer:   Diona Browner, AMY E [[id number]]    Follow-up: Return in about 2 weeks (around 05/31/2022) for f/u rash and schedule CPE, CPE not today.Eugenia Pancoast, FNP

## 2022-05-21 ENCOUNTER — Ambulatory Visit: Payer: Medicare HMO | Admitting: Dermatology

## 2022-05-21 VITALS — BP 150/86

## 2022-05-21 DIAGNOSIS — Z79899 Other long term (current) drug therapy: Secondary | ICD-10-CM

## 2022-05-21 DIAGNOSIS — L237 Allergic contact dermatitis due to plants, except food: Secondary | ICD-10-CM | POA: Diagnosis not present

## 2022-05-21 DIAGNOSIS — L2089 Other atopic dermatitis: Secondary | ICD-10-CM | POA: Diagnosis not present

## 2022-05-21 MED ORDER — MOMETASONE FUROATE 0.1 % EX CREA: 1.0000 | TOPICAL_CREAM | Freq: Every day | CUTANEOUS | 1 refills | Status: DC | PRN

## 2022-05-21 NOTE — Progress Notes (Signed)
   New Patient Visit  Subjective  Patrick Thompson is a 72 y.o. male who presents for the following: Rash (Lower legs x 6-8 months - he was given TMC 0.1% cream and it went away but it came back./Poison ivy of arms and back - Started prednisone last Thursday and started an OTC cream).  The following portions of the chart were reviewed this encounter and updated as appropriate:   Tobacco  Allergies  Meds  Problems  Med Hx  Surg Hx  Fam Hx     Review of Systems:  No other skin or systemic complaints except as noted in HPI or Assessment and Plan.  Objective  Well appearing patient in no apparent distress; mood and affect are within normal limits.  A focused examination was performed including trunk, extremities. Relevant physical exam findings are noted in the Assessment and Plan.  Legs Pink patches   Assessment & Plan  Allergic contact dermatitis due to plants, except food Arms, back Continue Prednisone taper until finished. Recheck on follow up.  Other atopic dermatitis Legs Atopic dermatitis (eczema) is a chronic, relapsing, pruritic condition that can significantly affect quality of life. It is often associated with allergic rhinitis and/or asthma and can require treatment with topical medications, phototherapy, or in severe cases biologic injectable medication (Dupixent; Adbry) or Oral JAK inhibitors.  Continue TMC 0.1% cream qd-bid until finished then start Mometasone cream qd bid 5 days per week prn until clear  mometasone (ELOCON) 0.1 % cream - Legs Apply 1 Application topically daily as needed (Rash).  Return for 6-8 weeks , Follow up.  I, Ashok Cordia, CMA, am acting as scribe for Sarina Ser, MD . Documentation: I have reviewed the above documentation for accuracy and completeness, and I agree with the above.  Sarina Ser, MD

## 2022-05-21 NOTE — Patient Instructions (Signed)
Due to recent changes in healthcare laws, you may see results of your pathology and/or laboratory studies on MyChart before the doctors have had a chance to review them. We understand that in some cases there may be results that are confusing or concerning to you. Please understand that not all results are received at the same time and often the doctors may need to interpret multiple results in order to provide you with the best plan of care or course of treatment. Therefore, we ask that you please give us 2 business days to thoroughly review all your results before contacting the office for clarification. Should we see a critical lab result, you will be contacted sooner.   If You Need Anything After Your Visit  If you have any questions or concerns for your doctor, please call our main line at [phone] and press option 4 to reach your doctor's medical assistant. If no one answers, please leave a voicemail as directed and we will return your call as soon as possible. Messages left after 4 pm will be answered the following business day.   You may also send us a message via MyChart. We typically respond to MyChart messages within 1-2 business days.  For prescription refills, please ask your pharmacy to contact our office. Our fax number is [phone].  If you have an urgent issue when the clinic is closed that cannot wait until the next business day, you can page your doctor at the number below.    Please note that while we do our best to be available for urgent issues outside of office hours, we are not available 24/7.   If you have an urgent issue and are unable to reach us, you may choose to seek medical care at your doctor's office, retail clinic, urgent care center, or emergency room.  If you have a medical emergency, please immediately call 911 or go to the emergency department.  Pager Numbers  - Dr. Kowalski: [phone]  - Dr. Moye: [phone]  - Dr. Stewart:  [phone]  In the event of inclement weather, please call our main line at [phone] for an update on the status of any delays or closures.  Dermatology Medication Tips: Please keep the boxes that topical medications come in in order to help keep track of the instructions about where and how to use these. Pharmacies typically print the medication instructions only on the boxes and not directly on the medication tubes.   If your medication is too expensive, please contact our office at [phone] option 4 or send us a message through MyChart.   We are unable to tell what your co-pay for medications will be in advance as this is different depending on your insurance coverage. However, we may be able to find a substitute medication at lower cost or fill out paperwork to get insurance to cover a needed medication.   If a prior authorization is required to get your medication covered by your insurance company, please allow us 1-2 business days to complete this process.  Drug prices often vary depending on where the prescription is filled and some pharmacies may offer cheaper prices.  The website www.goodrx.com contains coupons for medications through different pharmacies. The prices here do not account for what the cost may be with help from insurance (it may be cheaper with your insurance), but the website can give you the price if you did not use any insurance.  - You can print the associated coupon and take it with   your prescription to the pharmacy.  - You may also stop by our office during regular business hours and pick up a GoodRx coupon card.  - If you need your prescription sent electronically to a different pharmacy, notify our office through [facility] MyChart or by phone at [phone] option 4.     Si Usted Necesita Algo Despus de Su Visita  Tambin puede enviarnos un mensaje a travs de MyChart. Por lo general respondemos a los mensajes de MyChart en el transcurso de 1 a 2  das hbiles.  Para renovar recetas, por favor pida a su farmacia que se ponga en contacto con nuestra oficina. Nuestro nmero de fax es el [phone].  Si tiene un asunto urgente cuando la clnica est cerrada y que no puede esperar hasta el siguiente da hbil, puede llamar/localizar a su doctor(a) al nmero que aparece a continuacin.   Por favor, tenga en cuenta que aunque hacemos todo lo posible para estar disponibles para asuntos urgentes fuera del horario de oficina, no estamos disponibles las 24 horas del da, los 7 das de la semana.   Si tiene un problema urgente y no puede comunicarse con nosotros, puede optar por buscar atencin mdica  en el consultorio de su doctor(a), en una clnica privada, en un centro de atencin urgente o en una sala de emergencias.  Si tiene una emergencia mdica, por favor llame inmediatamente al 911 o vaya a la sala de emergencias.  Nmeros de bper  - Dr. Kowalski: [phone]  - Dra. Moye: [phone]  - Dra. Stewart: [phone]  En caso de inclemencias del tiempo, por favor llame a nuestra lnea principal al [phone] para una actualizacin sobre el estado de cualquier retraso o cierre.  Consejos para la medicacin en dermatologa: Por favor, guarde las cajas en las que vienen los medicamentos de uso tpico para ayudarle a seguir las instrucciones sobre dnde y cmo usarlos. Las farmacias generalmente imprimen las instrucciones del medicamento slo en las cajas y no directamente en los tubos del medicamento.   Si su medicamento es muy caro, por favor, pngase en contacto con nuestra oficina llamando al [phone] y presione la opcin 4 o envenos un mensaje a travs de MyChart.   No podemos decirle cul ser su copago por los medicamentos por adelantado ya que esto es diferente dependiendo de la cobertura de su seguro. Sin embargo, es posible que podamos encontrar un medicamento sustituto a menor costo o llenar un formulario para que el  seguro cubra el medicamento que se considera necesario.   Si se requiere una autorizacin previa para que su compaa de seguros cubra su medicamento, por favor permtanos de 1 a 2 das hbiles para completar este proceso.  Los precios de los medicamentos varan con frecuencia dependiendo del lugar de dnde se surte la receta y alguna farmacias pueden ofrecer precios ms baratos.  El sitio web www.goodrx.com tiene cupones para medicamentos de diferentes farmacias. Los precios aqu no tienen en cuenta lo que podra costar con la ayuda del seguro (puede ser ms barato con su seguro), pero el sitio web puede darle el precio si no utiliz ningn seguro.  - Puede imprimir el cupn correspondiente y llevarlo con su receta a la farmacia.  - Tambin puede pasar por nuestra oficina durante el horario de atencin regular y recoger una tarjeta de cupones de GoodRx.  - Si necesita que su receta se enve electrnicamente a una farmacia diferente, informe a nuestra oficina a travs de MyChart de [place]   o por telfono llamando al [phone] y presione la opcin 4.  

## 2022-05-23 ENCOUNTER — Encounter: Payer: Self-pay | Admitting: Dermatology

## 2022-05-31 DIAGNOSIS — H524 Presbyopia: Secondary | ICD-10-CM | POA: Diagnosis not present

## 2022-05-31 DIAGNOSIS — H25033 Anterior subcapsular polar age-related cataract, bilateral: Secondary | ICD-10-CM | POA: Diagnosis not present

## 2022-05-31 DIAGNOSIS — H5203 Hypermetropia, bilateral: Secondary | ICD-10-CM | POA: Diagnosis not present

## 2022-05-31 DIAGNOSIS — H4322 Crystalline deposits in vitreous body, left eye: Secondary | ICD-10-CM | POA: Diagnosis not present

## 2022-05-31 DIAGNOSIS — H2513 Age-related nuclear cataract, bilateral: Secondary | ICD-10-CM | POA: Diagnosis not present

## 2022-05-31 DIAGNOSIS — H52223 Regular astigmatism, bilateral: Secondary | ICD-10-CM | POA: Diagnosis not present

## 2022-06-08 ENCOUNTER — Encounter: Payer: Self-pay | Admitting: Family

## 2022-06-08 ENCOUNTER — Ambulatory Visit (INDEPENDENT_AMBULATORY_CARE_PROVIDER_SITE_OTHER): Payer: Medicare HMO | Admitting: Family

## 2022-06-08 VITALS — BP 130/78 | HR 64 | Temp 98.7°F | Ht 71.0 in | Wt 205.2 lb

## 2022-06-08 DIAGNOSIS — B354 Tinea corporis: Secondary | ICD-10-CM | POA: Diagnosis not present

## 2022-06-08 DIAGNOSIS — R21 Rash and other nonspecific skin eruption: Secondary | ICD-10-CM | POA: Diagnosis not present

## 2022-06-08 MED ORDER — ECONAZOLE NITRATE 1 % EX CREA: TOPICAL_CREAM | Freq: Every day | CUTANEOUS | 0 refills | Status: DC

## 2022-06-08 NOTE — Patient Instructions (Signed)
  Trial mometasone cream one arm, antifungal on the other for next two days to see how response is to the rash. If one works > than the other, please continue with that cream. Call dermatology for sooner appointment to address arms in case we need closer evaluation and or biopsy to determine cause.   Take nightly zyrtec and or benadryl to help with the itching.    Regards,   Eugenia Pancoast FNP-C

## 2022-06-08 NOTE — Assessment & Plan Note (Addendum)
Continue mometasone for bil anterior shins prn  For bil forearms trial econazole for suspected ringworm, although may still be result of poison ivy will have to have a trial to determine treatment path  Did advise pt to f/u with dermatology as well as failure to respond completely to prednisone with arm ras. Ok to use benadryl/zyrtec at night for itch and soak in aveeno baths

## 2022-06-08 NOTE — Progress Notes (Signed)
Established Patient Office Visit  Subjective:   Patient ID: Patrick Thompson, male    DOB: 08/04/1950  Age: 72 y.o. MRN: [id number]  CC:  Chief Complaint  Patient presents with   Rash    HPI: Patrick Thompson is a 72 y.o. male presenting on 06/08/2022 for Rash  Rash, bil anterior arms, still ongoing. Completed 15 day prednisone taper, states almost completely went away then went medication stopped, the rash came back.   He did see dermatology for bil lower anterior shin rash, was given mometasone cream with good relief and repsonse however the anterior arms were not addressed.   He has not used lotrisone on the upper arms, was unaware of this RX.  States rash on arms is pretty itchy, using otc anti itch cream with only mild relief.    Rash       ROS: Negative unless specifically indicated above in HPI.   Relevant past medical history reviewed and updated as indicated.   Allergies and medications reviewed and updated.   Current Outpatient Medications:    atorvastatin (LIPITOR) 20 MG tablet, Take 1 tablet (20 mg total) by mouth daily., Disp: 90 tablet, Rfl: 1   clotrimazole-betamethasone (LOTRISONE) cream, Apply 1 Application topically daily., Disp: 30 g, Rfl: 1   econazole nitrate 1 % cream, Apply topically daily., Disp: 15 g, Rfl: 0   EPINEPHrine 0.3 mg/0.3 mL IJ SOAJ injection, INJECT 1 PEN INTO THE MUSCLE AS DIRECTED FOR SEVERE ALLERGIC REACTION, THEN GO TO ER, Disp: 1 each, Rfl: 1   mometasone (ELOCON) 0.1 % cream, Apply 1 Application topically daily as needed (Rash)., Disp: 45 g, Rfl: 1   triamcinolone cream (KENALOG) 0.1 %, Apply 1 Application topically 2 (two) times daily., Disp: 30 g, Rfl: 0  Allergies  Allergen Reactions   Bee Venom Anaphylaxis and Hives    Has epi pen     Objective:   BP 130/78   Pulse 64   Temp 98.7 F (37.1 C) (Temporal)   Ht '5\' 11"'$  (1.803 m)   Wt 205 lb 3.2 oz (93.1 kg)   SpO2 98%   BMI 28.62 kg/m    Physical  Exam Constitutional:      General: He is not in acute distress.    Appearance: Normal appearance. He is normal weight. He is not ill-appearing, toxic-appearing or diaphoretic.  Cardiovascular:     Rate and Rhythm: Normal rate.  Pulmonary:     Effort: Pulmonary effort is normal.  Musculoskeletal:        General: Normal range of motion.  Skin:    Comments: Bil anterior shins with improving rash with slight scaling and erythema   Bil anterior forearms with rounded raised red erythematic rash with slight scaly texture   Neurological:     General: No focal deficit present.     Mental Status: He is alert and oriented to person, place, and time. Mental status is at baseline.  Psychiatric:        Mood and Affect: Mood normal.        Behavior: Behavior normal.        Thought Content: Thought content normal.        Judgment: Judgment normal.         Assessment & Plan:  Ringworm of body -     Econazole Nitrate; Apply topically daily.  Dispense: 15 g; Refill: 0  Rash and nonspecific skin eruption Assessment & Plan: Continue mometasone for bil anterior shins prn  For bil  forearms trial econazole for suspected ringworm, although may still be result of poison ivy will have to have a trial to determine treatment path  Did advise pt to f/u with dermatology as well as failure to respond completely to prednisone with arm ras. Ok to use benadryl/zyrtec at night for itch and soak in aveeno baths        Follow up plan: Return in about 3 months (around 09/06/2022), or if symptoms worsen or fail to improve, for f/u CPE after August 17, 2022.  Eugenia Pancoast, FNP

## 2022-06-25 DIAGNOSIS — Z0100 Encounter for examination of eyes and vision without abnormal findings: Secondary | ICD-10-CM | POA: Diagnosis not present

## 2022-06-26 ENCOUNTER — Telehealth: Payer: Self-pay

## 2022-06-26 DIAGNOSIS — R21 Rash and other nonspecific skin eruption: Secondary | ICD-10-CM

## 2022-06-26 NOTE — Telephone Encounter (Signed)
PA for econazole nitrate cream sent. Key: [id number])

## 2022-07-02 NOTE — Telephone Encounter (Signed)
The PA was denied. Please see the covered alternatives below.

## 2022-07-03 MED ORDER — CICLOPIROX OLAMINE 0.77 % EX CREA: TOPICAL_CREAM | Freq: Two times a day (BID) | CUTANEOUS | 0 refills | Status: AC

## 2022-07-03 NOTE — Telephone Encounter (Signed)
Called patient reviewed all information and repeated back to me. Will call if any questions.  ? ?

## 2022-07-03 NOTE — Telephone Encounter (Signed)
  I have sent in alternative cream for rash as other one I had sent in was not approved by insurance. Sent to [facility].

## 2022-07-03 NOTE — Addendum Note (Signed)
Addended by: Eugenia Pancoast on: 07/03/2022 01:56 PM   Modules accepted: Orders

## 2022-07-23 ENCOUNTER — Ambulatory Visit: Payer: Medicare HMO | Admitting: Dermatology

## 2022-08-04 ENCOUNTER — Other Ambulatory Visit: Payer: Self-pay | Admitting: Family

## 2022-08-04 DIAGNOSIS — E782 Mixed hyperlipidemia: Secondary | ICD-10-CM

## 2022-08-08 ENCOUNTER — Other Ambulatory Visit: Payer: Self-pay | Admitting: Dermatology

## 2022-08-08 DIAGNOSIS — L2089 Other atopic dermatitis: Secondary | ICD-10-CM

## 2022-09-26 ENCOUNTER — Ambulatory Visit: Payer: Medicare HMO | Admitting: Dermatology

## 2022-09-26 VITALS — BP 149/83

## 2022-09-26 DIAGNOSIS — L2089 Other atopic dermatitis: Secondary | ICD-10-CM | POA: Diagnosis not present

## 2022-09-26 DIAGNOSIS — Z7189 Other specified counseling: Secondary | ICD-10-CM

## 2022-09-26 DIAGNOSIS — Z79899 Other long term (current) drug therapy: Secondary | ICD-10-CM

## 2022-09-26 MED ORDER — MOMETASONE FUROATE 0.1 % EX CREA: 1.0000 | TOPICAL_CREAM | CUTANEOUS | 2 refills | Status: DC

## 2022-09-26 MED ORDER — TACROLIMUS 0.1 % EX OINT: TOPICAL_OINTMENT | Freq: Every day | CUTANEOUS | 3 refills | Status: DC

## 2022-09-26 NOTE — Progress Notes (Signed)
   Follow-Up Visit   Subjective  Patrick Thompson is a 72 y.o. male who presents for the following: hx of AD legs 13m f/u, Mometasone cr qd Hx of ACD to plants arms, back, resolved   The following portions of the chart were reviewed this encounter and updated as appropriate: medications, allergies, medical history  Review of Systems:  No other skin or systemic complaints except as noted in HPI or Assessment and Plan.  Objective  Well appearing patient in no apparent distress; mood and affect are within normal limits.   A focused examination was performed of the following areas: Arms, legs  Relevant exam findings are noted in the Assessment and Plan.    Assessment & Plan   ATOPIC DERMATITIS Exam: pinkness L leg, arms 4% BSA  Chronic and persistent condition with duration or expected duration over one year. Condition is symptomatic / bothersome to patient. Not to goal.   Atopic dermatitis (eczema) is a chronic, relapsing, pruritic condition that can significantly affect quality of life. It is often associated with allergic rhinitis and/or asthma and can require treatment with topical medications, phototherapy, or in severe cases biologic injectable medication (Dupixent; Adbry) or Oral JAK inhibitors.  Treatment Plan: Start Tacrolumus 0.1% oint qd to eczema If flared can use Mometasone cream 3 days a week Friday, Saturday, Sunday  Recommend gentle skin care.   Topical steroids (such as triamcinolone, fluocinolone, fluocinonide, mometasone, clobetasol, halobetasol, betamethasone, hydrocortisone) can cause thinning and lightening of the skin if they are used for too long in the same area. Your physician has selected the right strength medicine for your problem and area affected on the body. Please use your medication only as directed by your physician to prevent side effects.    Return in about 5 months (around 02/26/2023) for AD f/u.  I, Ardis Rowan, RMA, am acting as scribe for  Armida Sans, MD .   Documentation: I have reviewed the above documentation for accuracy and completeness, and I agree with the above.  Armida Sans, MD

## 2022-09-26 NOTE — Patient Instructions (Addendum)
Start Tacrolumus 0.1% ointment once daily to areas of eczema on arms and legs Decrease Mometasone cream to 3 days a week Friday, Saturday and Sunday once a day only when flared    Due to recent changes in healthcare laws, you may see results of your pathology and/or laboratory studies on MyChart before the doctors have had a chance to review them. We understand that in some cases there may be results that are confusing or concerning to you. Please understand that not all results are received at the same time and often the doctors may need to interpret multiple results in order to provide you with the best plan of care or course of treatment. Therefore, we ask that you please give [place] 2 business days to thoroughly review all your results before contacting the office for clarification. Should we see a critical lab result, you will be contacted sooner.   If You Need Anything After Your Visit  If you have any questions or concerns for your doctor, please call our main line at [phone] and press option 4 to reach your doctor's medical assistant. If no one answers, please leave a voicemail as directed and we will return your call as soon as possible. Messages left after 4 pm will be answered the following business day.   You may also send [place] a message via MyChart. We typically respond to MyChart messages within 1-2 business days.  For prescription refills, please ask your pharmacy to contact our office. Our fax number is [phone].  If you have an urgent issue when the clinic is closed that cannot wait until the next business day, you can page your doctor at the number below.    Please note that while we do our best to be available for urgent issues outside of office hours, we are not available 24/7.   If you have an urgent issue and are unable to reach [place], you may choose to seek medical care at your doctor's office, retail clinic, urgent care center, or emergency room.  If you have a medical  emergency, please immediately call 911 or go to the emergency department.  Pager Numbers  - Dr. Gwen Pounds: [phone]  - Dr. Neale Burly: [phone]  - Dr. Roseanne Reno: [phone]  In the event of inclement weather, please call our main line at [phone] for an update on the status of any delays or closures.  Dermatology Medication Tips: Please keep the boxes that topical medications come in in order to help keep track of the instructions about where and how to use these. Pharmacies typically print the medication instructions only on the boxes and not directly on the medication tubes.   If your medication is too expensive, please contact our office at [phone] option 4 or send [place] a message through MyChart.   We are unable to tell what your co-pay for medications will be in advance as this is different depending on your insurance coverage. However, we may be able to find a substitute medication at lower cost or fill out paperwork to get insurance to cover a needed medication.   If a prior authorization is required to get your medication covered by your insurance company, please allow [place] 1-2 business days to complete this process.  Drug prices often vary depending on where the prescription is filled and some pharmacies may offer cheaper prices.  The website www.goodrx.com contains coupons for medications through different pharmacies. The prices here do not account for what the cost may be with help from  insurance (it may be cheaper with your insurance), but the website can give you the price if you did not use any insurance.  - You can print the associated coupon and take it with your prescription to the pharmacy.  - You may also stop by our office during regular business hours and pick up a GoodRx coupon card.  - If you need your prescription sent electronically to a different pharmacy, notify our office through [facility] or by phone at [phone] option 4.     Si Usted  Necesita Algo Despus de Su Visita  Tambin puede enviarnos un mensaje a travs de Clinical cytogeneticist. Por lo general respondemos a los mensajes de MyChart en el transcurso de 1 a 2 das hbiles.  Para renovar recetas, por favor pida a su farmacia que se ponga en contacto con nuestra oficina. Annie Sable de fax es [place] [phone].  Si tiene un asunto urgente cuando la clnica est cerrada y que no puede esperar hasta el siguiente da hbil, puede llamar/localizar a su doctor(a) al nmero que aparece a continuacin.   Por favor, tenga en cuenta que aunque hacemos todo lo posible para estar disponibles para asuntos urgentes fuera del horario de [place], no estamos disponibles las 24 horas del da, los 7 [address]  [address] de la [place].   Si tiene un problema urgente y no puede comunicarse con nosotros, puede optar por buscar atencin mdica  en el consultorio de su doctor(a), en una clnica privada, en un centro de atencin urgente o en una sala de emergencias.  Si tiene Engineer, drilling, por favor llame inmediatamente al 911 o vaya a la sala de emergencias.  Nmeros de bper  - Dr. Gwen Pounds: [phone]  - Dra. Moye: [phone]  - Dra. Roseanne Reno: [phone]  En caso de inclemencias del [place], por favor llame a Lacy Duverney principal al [phone] para una actualizacin sobre el [place] de cualquier retraso o cierre.  Consejos para la medicacin en dermatologa: Por favor, guarde las cajas en las que vienen los medicamentos de uso tpico para ayudarle a seguir las instrucciones sobre dnde y cmo usarlos. Las farmacias generalmente imprimen las instrucciones del medicamento slo en las cajas y no directamente en los tubos del [employer].   Si su medicamento es muy caro, por favor, pngase en contacto con Rolm Gala llamando al [phone] y presione la opcin 4 o envenos un mensaje a travs de Clinical cytogeneticist.   No podemos decirle cul ser su copago por los medicamentos por adelantado ya que esto es  diferente dependiendo de la cobertura de su seguro. Sin embargo, es posible que podamos encontrar un medicamento sustituto a Audiological scientist un formulario para que el seguro cubra el medicamento que se considera necesario.   Si se requiere una autorizacin previa para que su compaa de seguros [place] su medicamento, por favor permtanos de 1 a 2 das hbiles para completar [address]  Los precios de los medicamentos varan con frecuencia dependiendo del Environmental consultant de dnde se surte la receta y alguna farmacias pueden ofrecer precios ms baratos.  El sitio web www.goodrx.com tiene cupones para medicamentos de Health and safety inspector. Los precios aqu no tienen en cuenta lo que podra costar con la ayuda del seguro (puede ser ms barato con su seguro), pero el sitio web puede darle el precio si no utiliz Tourist information centre manager.  - Puede imprimir el cupn correspondiente y llevarlo con su receta a la farmacia.  - Tambin puede pasar por nuestra oficina durante el horario de  atencin regular y Education officer, museum una tarjeta de cupones de GoodRx.  - Si necesita que su receta se enve electrnicamente a una farmacia diferente, informe a nuestra oficina a travs de MyChart de [place] o por telfono llamando al [phone] y presione la opcin 4.

## 2022-10-09 ENCOUNTER — Encounter: Payer: Self-pay | Admitting: Dermatology

## 2023-02-28 ENCOUNTER — Ambulatory Visit: Payer: Medicare HMO | Admitting: Dermatology

## 2023-02-28 DIAGNOSIS — Z79899 Other long term (current) drug therapy: Secondary | ICD-10-CM | POA: Diagnosis not present

## 2023-02-28 DIAGNOSIS — L209 Atopic dermatitis, unspecified: Secondary | ICD-10-CM | POA: Diagnosis not present

## 2023-02-28 DIAGNOSIS — L2089 Other atopic dermatitis: Secondary | ICD-10-CM

## 2023-02-28 DIAGNOSIS — Z7189 Other specified counseling: Secondary | ICD-10-CM

## 2023-02-28 MED ORDER — PIMECROLIMUS 1 % EX CREA: TOPICAL_CREAM | CUTANEOUS | 11 refills | Status: AC

## 2023-02-28 MED ORDER — MOMETASONE FUROATE 0.1 % EX CREA: 1.0000 | TOPICAL_CREAM | CUTANEOUS | 5 refills | Status: DC

## 2023-02-28 MED ORDER — TACROLIMUS 0.1 % EX OINT: TOPICAL_OINTMENT | Freq: Every day | CUTANEOUS | 5 refills | Status: DC

## 2023-02-28 NOTE — Patient Instructions (Signed)

## 2023-02-28 NOTE — Progress Notes (Signed)
   Follow-Up Visit   Subjective  [name] is a 72 y.o. male who presents for the following: Atopic Dermatitis of the legs. Patient is mainly controlled using tacrolimus ointment as needed, and mometasone cream as needed Fri, Sat, and Sun. He had a recent flare with the weather change.   The following portions of the chart were reviewed this encounter and updated as appropriate: medications, allergies, medical history  Review of Systems:  No other skin or systemic complaints except as noted in HPI or Assessment and Plan.  Objective  Well appearing patient in no apparent distress; mood and affect are within normal limits.  Areas Examined: Face, legs, arms  Relevant physical exam findings are noted in the Assessment and Plan.    Assessment & Plan   Other atopic dermatitis  Counseling and coordination of care  Medication management   ATOPIC DERMATITIS Exam: Scaly pink papules coalescing to plaques on lower legs 6% BSA  Chronic and persistent condition with duration or expected duration over one year. Condition is improving with treatment but not currently at goal.   Atopic dermatitis (eczema) is a chronic, relapsing, pruritic condition that can significantly affect quality of life. It is often associated with allergic rhinitis and/or asthma and can require treatment with topical medications, phototherapy, or in severe cases biologic injectable medication (Dupixent; Adbry) or Oral JAK inhibitors.  Treatment Plan: Start pimecrolimus cream once a day to aa eczema, twice daily with flares dsp 60g 1 yr Rf. Continue tacrolimus ointment once a day to aa eczema, twice daily with flares. (if pimecrolimus not covered) Continue mometasone cream once to twice a day Friday, Saturday, Sunday. Megan Mans, Opzelura not covered under patient's insurance.  Recommend CeraVe Cream to legs daily after shower.   Recommend gentle skin care.  Long term medication management.  Patient is  using long term (months to years) prescription medication  to control their dermatologic condition.  These medications require periodic monitoring to evaluate for efficacy and side effects and may require periodic laboratory monitoring.   Return in about 1 year (around 02/28/2024) for Atopic Dermatitis.  Wendee Beavers, CMA, am acting as scribe for Armida Sans, MD .   Documentation: I have reviewed the above documentation for accuracy and completeness, and I agree with the above.  Armida Sans, MD

## 2023-03-15 ENCOUNTER — Encounter: Payer: Medicare HMO | Admitting: Family

## 2023-03-16 ENCOUNTER — Encounter: Payer: Self-pay | Admitting: Dermatology

## 2023-03-21 ENCOUNTER — Ambulatory Visit: Payer: Medicare HMO

## 2023-03-21 VITALS — Ht 71.0 in | Wt 200.0 lb

## 2023-03-21 DIAGNOSIS — Z0000 Encounter for general adult medical examination without abnormal findings: Secondary | ICD-10-CM

## 2023-03-21 NOTE — Progress Notes (Signed)
Subjective:   Patrick Thompson is a 72 y.o. male who presents for Medicare Annual/Subsequent preventive examination.  Visit Complete: Virtual I connected with  Patrick Thompson on 03/21/23 by a audio enabled telemedicine application and verified that I am speaking with the correct person using two identifiers.  Patient Location: Home  Provider Location: Office/Clinic  I discussed the limitations of evaluation and management by telemedicine. The patient expressed understanding and agreed to proceed.  Vital Signs: Because this visit was a virtual/telehealth visit, some criteria may be missing or patient reported. Any vitals not documented were not able to be obtained and vitals that have been documented are patient reported.  Cardiac Risk Factors include: advanced age (>2men, >65 women);dyslipidemia;family history of premature cardiovascular disease;hypertension;male gender     Objective:    Today's Vitals   03/21/23 0842  Weight: 200 lb (90.7 kg)  Height: 5\' 11"  (1.803 m)  PainSc: 0-No pain   Body mass index is 27.89 kg/m.     03/21/2023    8:44 AM 04/04/2022   10:38 AM 01/02/2018    3:16 PM 04/23/2017    8:34 AM 04/12/2017   11:32 AM 03/30/2016    6:03 PM 10/19/2015    6:17 PM  Advanced Directives  Does Patient Have a Medical Advance Directive? No No No No No No No  Would patient like information on creating a medical advance directive? No - Patient declined No - Patient declined No - Patient declined        Current Medications (verified) Outpatient Encounter Medications as of 03/21/2023  Medication Sig   atorvastatin (LIPITOR) 20 MG tablet TAKE 1 TABLET(20 MG) BY MOUTH DAILY   ciclopirox (LOPROX) 0.77 % cream Apply topically 2 (two) times daily.   EPINEPHrine 0.3 mg/0.3 mL IJ SOAJ injection INJECT 1 PEN INTO THE MUSCLE AS DIRECTED FOR SEVERE ALLERGIC REACTION, THEN GO TO ER   mometasone (ELOCON) 0.1 % cream Apply 1 Application topically as directed. Apply to areas of  eczema when flared once daily only 3 days a week, Friday, Saturday, Sunday   pimecrolimus (ELIDEL) 1 % cream Apply to affected areas rash on legs once a day, increasing to twice daily with flares.   tacrolimus (PROTOPIC) 0.1 % ointment Apply topically daily. Qd to eczema on arms and legs. May increase to twice daily with flares.   No facility-administered encounter medications on file as of 03/21/2023.    Allergies (verified) Bee venom   History: Past Medical History:  Diagnosis Date   Allergy    anaphylaxis with bee sting   BPH (benign prostatic hyperplasia)    2018, pt does not recollect this   Elevated blood pressure reading 05/17/2021   Elevated at visit 1/11 D/w pt blood pressure record and f/u in three weeks   HNP (herniated nucleus pulposus), lumbar 04/23/2017   Hypertension    No longer on medication   Prediabetes    Syncope 10/20/2015   Tobacco abuse    quit 2022   Past Surgical History:  Procedure Laterality Date   LUMBAR LAMINECTOMY/DECOMPRESSION MICRODISCECTOMY Bilateral 04/23/2017   Procedure: L4-5 microdiscectomy;  Surgeon: Donalee Citrin, MD;  [facility];  Service: Neurosurgery;  Laterality: Bilateral;   Family History  Problem Relation Age of Onset   Hypertension Mother    Diabetes Mother    Cancer Father        Prostate    Colon cancer Father    Heart disease Brother        Half brother,  MI in late 75s   Esophageal cancer Neg Hx    Rectal cancer Neg Hx    Stomach cancer Neg Hx    Social History   Socioeconomic History   Marital status: Single    Spouse name: Not on file   Number of children: Not on file   Years of education: Not on file   Highest education level: Not on file  Occupational History   Occupation: retired  Tobacco Use   Smoking status: Former    Current packs/day: 0.00    Average packs/day: 0.1 packs/day for 40.0 years (4.0 ttl pk-yrs)    Types: Cigarettes    Start date: 86    Quit date: 2022    Years since quitting: 2.8    Smokeless tobacco: Never  Vaping Use   Vaping status: Never Used  Substance and Sexual Activity   Alcohol use: Not Currently    Alcohol/week: 0.0 standard drinks of alcohol    Comment: quit june 2018   Drug use: No   Sexual activity: Yes    Partners: Female  Other Topics Concern   Not on file  Social History Narrative   Lives in [place]   6/21- has not talked to family about advanced directives. Would want CPR/ intubation/ artificial nutrition if chance to return to normal function. Has printed information at home regarding HCPOA/ Living Will.    Social Determinants of Health   Financial Resource Strain: Low Risk  (03/21/2023)   Overall Financial Resource Strain (CARDIA)    Difficulty of Paying Living Expenses: Not hard at all  Food Insecurity: No Food Insecurity (03/21/2023)   Hunger Vital Sign    Worried About Running Out of Food in the Last Year: Never true    Ran Out of Food in the Last Year: Never true  Transportation Needs: No Transportation Needs (03/21/2023)   PRAPARE - Administrator, Civil Service (Medical): No    Lack of Transportation (Non-Medical): No  Physical Activity: Sufficiently Active (03/21/2023)   Exercise Vital Sign    Days of Exercise per Week: 7 days    Minutes of Exercise per Session: 30 min  Stress: No Stress Concern Present (03/21/2023)   [employer] of Occupational Health - Occupational Stress Questionnaire    Feeling of Stress : Not at all  Social Connections: Unknown (03/21/2023)   Social Connection and Isolation Panel [NHANES]    Frequency of Communication with Friends and Family: More than three times a week    Frequency of Social Gatherings with Friends and Family: More than three times a week    Attends Religious Services: Not on Marketing executive or Organizations: Yes    Attends Engineer, structural: More than 4 times per year    Marital Status: Never married    Tobacco Counseling Counseling  given: Not Answered   Clinical Intake:  Pre-visit preparation completed: Yes  Pain : No/denies pain Pain Score: 0-No pain     BMI - recorded: 27.89 Nutritional Status: BMI 25 -29 Overweight Nutritional Risks: None Diabetes: No  How often do you need to have someone help you when you read instructions, pamphlets, or other written materials from your doctor or pharmacy?: 1 - Never What is the last grade level you completed in school?: HSG  Interpreter Needed?: No  Information entered by :: Jazmeen Axtell N. Evadean Sproule, LPN.   Activities of Daily Living    03/21/2023    8:47 AM 04/04/2022  10:37 AM  In your present state of health, do you have any difficulty performing the following activities:  Hearing? 0 0  Vision? 0 0  Difficulty concentrating or making decisions? 0 0  Walking or climbing stairs? 0 0  Dressing or bathing? 0 0  Doing errands, shopping? 0 0  Preparing Food and eating ? N N  Using the Toilet? N N  In the past six months, have you accidently leaked urine? N N  Do you have problems with loss of bowel control? N N  Managing your Medications? N N  Managing your Finances? N N  Housekeeping or managing your Housekeeping? N N    Patient Care Team: Mort Sawyers, FNP as PCP - General (Family Medicine) Blair Promise, OD as Consulting Physician (Optometry)  Indicate any recent Medical Services you may have received from other than [facility] providers in the past year (date may be approximate).     Assessment:   This is a routine wellness examination for [place].  Hearing/Vision screen Hearing Screening - Comments:: Denies hearing difficulties   Vision Screening - Comments:: Wears rx glasses - up to date with routine eye exams with Blair Promise, OD.    Goals Addressed               This Visit's Progress     Maintain healthy lifestyle. (pt-stated)        Stay active Healthy diet Good water intake      Depression Screen    03/21/2023     8:46 AM 05/17/2022   10:13 AM 04/04/2022   10:37 AM 05/17/2021    8:29 AM 10/12/2019   10:52 AM 10/15/2018    9:31 AM 06/28/2017    3:22 PM  PHQ 2/9 Scores  PHQ - 2 Score 0 1 0 0 0 0 0  PHQ- 9 Score 0 1    0     Fall Risk    03/21/2023    8:45 AM 04/04/2022   10:37 AM 05/17/2021    8:29 AM 10/12/2019   10:52 AM 10/15/2018    9:31 AM  Fall Risk   Falls in the past year? 0 0 0 0 0  Number falls in past yr: 0 0 0  0  Injury with Fall? 0 0 0  0  Risk for fall due to : No Fall Risks No Fall Risks     Follow up Falls prevention discussed Falls evaluation completed;Education provided       MEDICARE RISK AT HOME: Medicare Risk at Home Any stairs in or around the home?: No If so, are there any without handrails?: No Home free of loose throw rugs in walkways, pet beds, electrical cords, etc?: Yes Adequate lighting in your home to reduce risk of falls?: Yes Life alert?: No Use of a cane, walker or w/c?: No Grab bars in the bathroom?: No Shower chair or bench in shower?: No Elevated toilet seat or a handicapped toilet?: No  TIMED UP AND GO:  Was the test performed?  No    Cognitive Function:    03/21/2023    8:46 AM  MMSE - Mini Mental State Exam  Not completed: Unable to complete        03/21/2023    8:46 AM 04/04/2022   11:03 AM  6CIT Screen  What Year? 0 points 0 points  What month? 0 points 0 points  What time? 0 points 0 points  Count back from 20 0 points 0 points  Months  in reverse 0 points 0 points  Repeat phrase 0 points 0 points  Total Score 0 points 0 points    Immunizations Immunization History  Administered Date(s) Administered   Pneumococcal Conjugate-13 10/30/2018    TDAP status: Due, Education has been provided regarding the importance of this vaccine. Advised may receive this vaccine at local pharmacy or Health Dept. Aware to provide a copy of the vaccination record if obtained from local pharmacy or Health Dept. Verbalized acceptance and  understanding.  Flu Vaccine status: Declined, Education has been provided regarding the importance of this vaccine but patient still declined. Advised may receive this vaccine at local pharmacy or Health Dept. Aware to provide a copy of the vaccination record if obtained from local pharmacy or Health Dept. Verbalized acceptance and understanding.  Pneumococcal vaccine status: Declined,  Education has been provided regarding the importance of this vaccine but patient still declined. Advised may receive this vaccine at local pharmacy or Health Dept. Aware to provide a copy of the vaccination record if obtained from local pharmacy or Health Dept. Verbalized acceptance and understanding.   Covid-19 vaccine status: Declined, Education has been provided regarding the importance of this vaccine but patient still declined. Advised may receive this vaccine at local pharmacy or Health Dept.or vaccine clinic. Aware to provide a copy of the vaccination record if obtained from local pharmacy or Health Dept. Verbalized acceptance and understanding.  Qualifies for Shingles Vaccine? Yes   Zostavax completed No   Shingrix Completed?: No.    Education has been provided regarding the importance of this vaccine. Patient has been advised to call insurance company to determine out of pocket expense if they have not yet received this vaccine. Advised may also receive vaccine at local pharmacy or Health Dept. Verbalized acceptance and understanding.  Screening Tests Health Maintenance  Topic Date Due   DTaP/Tdap/Td (1 - Tdap) Never done   Pneumonia Vaccine 59+ Years old (2 of 2 - PPSV23 or PCV20) 10/30/2019   Colonoscopy  05/27/2021   INFLUENZA VACCINE  Never done   Medicare Annual Wellness (AWV)  03/20/2024   Hepatitis C Screening  Completed   HPV VACCINES  Aged Out   COVID-19 Vaccine  Discontinued   Zoster Vaccines- Shingrix  Discontinued    Health Maintenance  Health Maintenance Due  Topic Date Due    DTaP/Tdap/Td (1 - Tdap) Never done   Pneumonia Vaccine 18+ Years old (2 of 2 - PPSV23 or PCV20) 10/30/2019   Colonoscopy  05/27/2021   INFLUENZA VACCINE  Never done    Colorectal cancer screening: Type of screening: Colonoscopy. Completed 05/27/2018. Repeat every 3 years Patient has received letter from Dr. Yancey Flemings to schedule.  Lung Cancer Screening: (Low Dose CT Chest recommended if Age 44-80 years, 20 pack-year currently smoking OR have quit w/in 15years.) does not qualify.   Lung Cancer Screening Referral: no   Additional Screening:  Hepatitis C Screening: does qualify; Completed 10/15/2018  Vision Screening: Recommended annual ophthalmology exams for early detection of glaucoma and other disorders of the eye. Is the patient up to date with their annual eye exam?  Yes  Who is the provider or what is the name of the office in which the patient attends annual eye exams? Neill Bulakowski, OD. If pt is not established with a provider, would they like to be referred to a provider to establish care? No .   Dental Screening: Recommended annual dental exams for proper oral hygiene  Diabetic Foot Exam: N/A  Community Resource Referral / Chronic Care Management: CRR required this visit?  No   CCM required this visit?  No     Plan:     I have personally reviewed and noted the following in the patient's chart:   Medical and social history Use of alcohol, tobacco or illicit drugs  Current medications and supplements including opioid prescriptions. Patient is not currently taking opioid prescriptions. Functional ability and status Nutritional status Physical activity Advanced directives List of other physicians Hospitalizations, surgeries, and ER visits in previous 12 months Vitals Screenings to include cognitive, depression, and falls Referrals and appointments  In addition, I have reviewed and discussed with patient certain preventive protocols, quality metrics, and best  practice recommendations. A written personalized care plan for preventive services as well as general preventive health recommendations were provided to patient.     Mickeal Needy, LPN   52/84/1324   After Visit Summary: (Declined) Due to this being a telephonic visit, with patients personalized plan was offered to patient but patient Declined AVS at this time   Nurse Notes: None

## 2023-03-21 NOTE — Patient Instructions (Addendum)
[name] , Thank you for taking time to come for your Medicare Wellness Visit. I appreciate your ongoing commitment to your health goals. Please review the following plan we discussed and let me know if I can assist you in the future.   Referrals/Orders/Follow-Ups/Clinician Recommendations: No  This is a list of the screening recommended for you and due dates:  Health Maintenance  Topic Date Due   DTaP/Tdap/Td vaccine (1 - Tdap) Never done   Pneumonia Vaccine (2 of 2 - PPSV23 or PCV20) 10/30/2019   Colon Cancer Screening  05/27/2021   Flu Shot  Never done   Medicare Annual Wellness Visit  03/20/2024   Hepatitis C Screening  Completed   HPV Vaccine  Aged Out   COVID-19 Vaccine  Discontinued   Zoster (Shingles) Vaccine  Discontinued    Advanced directives: (Declined) Advance directive discussed with you today. Even though you declined this today, please call our office should you change your mind, and we can give you the proper paperwork for you to fill out.  Next Medicare Annual Wellness Visit scheduled for next year: Yes

## 2023-03-29 ENCOUNTER — Encounter: Payer: Self-pay | Admitting: Family

## 2023-03-29 ENCOUNTER — Ambulatory Visit (INDEPENDENT_AMBULATORY_CARE_PROVIDER_SITE_OTHER): Payer: Medicare HMO | Admitting: Family

## 2023-03-29 VITALS — BP 132/76 | HR 56 | Temp 97.8°F | Ht 71.0 in | Wt 200.2 lb

## 2023-03-29 DIAGNOSIS — B370 Candidal stomatitis: Secondary | ICD-10-CM

## 2023-03-29 DIAGNOSIS — Z23 Encounter for immunization: Secondary | ICD-10-CM | POA: Diagnosis not present

## 2023-03-29 DIAGNOSIS — Z125 Encounter for screening for malignant neoplasm of prostate: Secondary | ICD-10-CM | POA: Diagnosis not present

## 2023-03-29 DIAGNOSIS — E782 Mixed hyperlipidemia: Secondary | ICD-10-CM

## 2023-03-29 DIAGNOSIS — R7303 Prediabetes: Secondary | ICD-10-CM

## 2023-03-29 DIAGNOSIS — Z0001 Encounter for general adult medical examination with abnormal findings: Secondary | ICD-10-CM | POA: Diagnosis not present

## 2023-03-29 DIAGNOSIS — Z87891 Personal history of nicotine dependence: Secondary | ICD-10-CM | POA: Diagnosis not present

## 2023-03-29 DIAGNOSIS — Z0000 Encounter for general adult medical examination without abnormal findings: Secondary | ICD-10-CM | POA: Insufficient documentation

## 2023-03-29 LAB — COMPREHENSIVE METABOLIC PANEL
ALT: 21 U/L (ref 0–53)
AST: 15 U/L (ref 0–37)
Albumin: 4.4 g/dL (ref 3.5–5.2)
Alkaline Phosphatase: 86 U/L (ref 39–117)
BUN: 20 mg/dL (ref 6–23)
CO2: 28 meq/L (ref 19–32)
Calcium: 9.2 mg/dL (ref 8.4–10.5)
Chloride: 105 meq/L (ref 96–112)
Creatinine, Ser: 1.21 mg/dL (ref 0.40–1.50)
GFR: 60.46 mL/min (ref >60.00)
Glucose, Bld: 104 mg/dL — ABNORMAL HIGH (ref 70–99)
Potassium: 4.4 meq/L (ref 3.5–5.1)
Sodium: 141 meq/L (ref 135–145)
Total Bilirubin: 0.6 mg/dL (ref 0.2–1.2)
Total Protein: 7.1 g/dL (ref 6.0–8.3)

## 2023-03-29 LAB — PSA: PSA: 0.54 ng/mL (ref 0.10–4.00)

## 2023-03-29 LAB — LIPID PANEL
Cholesterol: 126 mg/dL (ref 0–200)
HDL: 33.8 mg/dL — ABNORMAL LOW (ref >39.00)
LDL Cholesterol: 74 mg/dL (ref 0–99)
NonHDL: 92.57
Total CHOL/HDL Ratio: 4
Triglycerides: 93 mg/dL (ref 0.0–149.0)
VLDL: 18.6 mg/dL (ref 0.0–40.0)

## 2023-03-29 MED ORDER — NYSTATIN 100000 UNIT/ML MT SUSP: 5.0000 mL | Freq: Four times a day (QID) | OROMUCOSAL | 0 refills | Status: AC

## 2023-03-29 NOTE — Patient Instructions (Addendum)
  ------------------------------------    Your imaging for ultrasound of the aorta Has been ordered. Call the below number to get scheduled at your convenience.  [facility] [address] [place] [address] Phone: [phone],  8-5 pm    ------------------------------------  Referral to lung cancer screening program made. Please let us know if you have not heard back within 2 weeks about the referral.   ------------------------------------

## 2023-03-29 NOTE — Assessment & Plan Note (Signed)
Ordered lipid panel, pending results. Work on low cholesterol diet and exercise as tolerated rrx refill sent for lipitor 20 mg

## 2023-03-29 NOTE — Assessment & Plan Note (Signed)
A1c ordered pending results   Work on diabetic diet and exercise as tolerated.

## 2023-03-29 NOTE — Assessment & Plan Note (Signed)
Referral to lung cancer screening program made.

## 2023-03-29 NOTE — Assessment & Plan Note (Signed)
Patient Counseling(The following topics were reviewed): ? Preventative care handout given to pt  ?Health maintenance and immunizations reviewed. Please refer to Health maintenance section. ?Pt advised on safe sex, wearing seatbelts in car, and proper nutrition ?labwork ordered today for annual ?Dental health: Discussed importance of regular tooth brushing, flossing, and dental visits. ? ? ?

## 2023-03-29 NOTE — Progress Notes (Signed)
Subjective:  Patient ID: Patrick Thompson, male    DOB: 1950-12-27  Age: 72 y.o. MRN: [id number]  Patient Care Team: Mort Sawyers, FNP as PCP - General (Family Medicine) Blair Promise, OD as Consulting Physician (Optometry)   CC:  Chief Complaint  Patient presents with  . Annual Exam    HPI Patrick Thompson is a 72 y.o. male who presents today for an annual physical exam. He reports consuming a  general however currently soft foods because recently had teeth pulled and awaiting dentures .  Walks regularly  He generally feels well. He reports sleeping well. He does not have additional problems to discuss today.   Vision:Within last year Dental:Receives regular dental care  Lung Cancer Screening with low-dose Chest CT: smoked 40 years quit in 2022 - Adults age 54-80 who are current cigarette smokers or quit within the last 15 years. Must have 20 pack year history.  AAA Screening: ordering now - Men age 52-75 who have ever smoked  Colonoscopy: 05/27/2018 , pt has to make appt.  Pt is without acute concerns.   Recently had most of his teeth pulled, awaiting to be set up for dentures.   Advanced Directives Patient does not have advanced directives   DEPRESSION SCREENING    03/29/2023    7:26 AM 03/21/2023    8:46 AM 05/17/2022   10:13 AM 04/04/2022   10:37 AM 05/17/2021    8:29 AM 10/12/2019   10:52 AM 10/15/2018    9:31 AM  PHQ 2/9 Scores  PHQ - 2 Score 0 0 1 0 0 0 0  PHQ- 9 Score 0 0 1    0     ROS: Negative unless specifically indicated above in HPI.    Current Outpatient Medications:  .  atorvastatin (LIPITOR) 20 MG tablet, TAKE 1 TABLET(20 MG) BY MOUTH DAILY, Disp: 90 tablet, Rfl: 3 .  ciclopirox (LOPROX) 0.77 % cream, Apply topically 2 (two) times daily., Disp: 15 g, Rfl: 0 .  EPINEPHrine 0.3 mg/0.3 mL IJ SOAJ injection, INJECT 1 PEN INTO THE MUSCLE AS DIRECTED FOR SEVERE ALLERGIC REACTION, THEN GO TO ER, Disp: 1 each, Rfl: 1 .  mometasone (ELOCON) 0.1 % cream,  Apply 1 Application topically as directed. Apply to areas of eczema when flared once daily only 3 days a week, Friday, Saturday, Sunday, Disp: 45 g, Rfl: 5 .  nystatin (MYCOSTATIN) 100000 UNIT/ML suspension, Take 5 mLs (500,000 Units total) by mouth 4 (four) times daily., Disp: 60 mL, Rfl: 0 .  pimecrolimus (ELIDEL) 1 % cream, Apply to affected areas rash on legs once a day, increasing to twice daily with flares., Disp: 60 g, Rfl: 11 .  tacrolimus (PROTOPIC) 0.1 % ointment, Apply topically daily. Qd to eczema on arms and legs. May increase to twice daily with flares., Disp: 100 g, Rfl: 5    Objective:    BP 132/76 (BP Location: Left Arm, Patient Position: Sitting, Cuff Size: Normal)   Pulse (!) 56   Temp 97.8 F (36.6 C) (Temporal)   Ht 5\' 11"  (1.803 m)   Wt 200 lb 3.2 oz (90.8 kg)   SpO2 98%   BMI 27.92 kg/m   BP Readings from Last 3 Encounters:  03/29/23 132/76  09/26/22 (!) 149/83  06/08/22 130/78      Physical Exam Vitals reviewed.  Constitutional:      General: He is not in acute distress.    Appearance: Normal appearance. He is normal weight. He is not ill-appearing, toxic-appearing  or diaphoretic.  HENT:     Head: Normocephalic.     Right Ear: Tympanic membrane normal.     Left Ear: Tympanic membrane normal.     Nose: Nose normal.     Mouth/Throat:     Mouth: Mucous membranes are moist.     Tongue: Lesions (white budding) present.  Eyes:     Pupils: Pupils are equal, round, and reactive to light.  Cardiovascular:     Rate and Rhythm: Normal rate and regular rhythm.  Pulmonary:     Effort: Pulmonary effort is normal.     Breath sounds: Normal breath sounds.  Abdominal:     General: Abdomen is flat.     Tenderness: There is no abdominal tenderness.  Musculoskeletal:        General: Normal range of motion.  Skin:    General: Skin is warm.  Neurological:     General: No focal deficit present.     Mental Status: He is alert and oriented to person, place, and  time. Mental status is at baseline.  Psychiatric:        Mood and Affect: Mood normal.        Behavior: Behavior normal.        Thought Content: Thought content normal.        Judgment: Judgment normal.         Assessment & Plan:  History of tobacco abuse Assessment & Plan: Referral to lung cancer screening program made.   Orders: -     Ambulatory Referral for Lung Cancer Scre -     [place] MEDICARE SCREENING; Future  Screening for malignant neoplasm of prostate -     PSA  Encounter for general adult medical examination without abnormal findings Assessment & Plan: Patient Counseling(The following topics were reviewed):  Preventative care handout given to pt  Health maintenance and immunizations reviewed. Please refer to Health maintenance section. Pt advised on safe sex, wearing seatbelts in car, and proper nutrition labwork ordered today for annual Dental health: Discussed importance of regular tooth brushing, flossing, and dental visits.   Orders: -     PSA -     Lipid panel -     Hemoglobin A1c; Future -     Comprehensive metabolic panel  Mixed hyperlipidemia Assessment & Plan: Ordered lipid panel, pending results. Work on low cholesterol diet and exercise as tolerated rrx refill sent for lipitor 20 mg  Orders: -     Lipid panel  Prediabetes Assessment & Plan: A1c ordered pending results   Work on diabetic diet and exercise as tolerated.  Orders: -     Hemoglobin A1c; Future -     Comprehensive metabolic panel  Oral candida Assessment & Plan: Sending in nystatin   Orders: -     Nystatin; Take 5 mLs (500,000 Units total) by mouth 4 (four) times daily.  Dispense: 60 mL; Refill: 0      Follow-up: Return in about 1 year (around 03/28/2024) for f/u CPE.   Mort Sawyers, FNP

## 2023-03-29 NOTE — Addendum Note (Signed)
Addended by: Jaynee Eagles C on: 03/29/2023 08:57 AM   Modules accepted: Orders

## 2023-03-29 NOTE — Assessment & Plan Note (Signed)
Sending in nystatin

## 2023-04-01 NOTE — Progress Notes (Signed)
Labs normal.

## 2023-04-02 ENCOUNTER — Telehealth: Payer: Self-pay | Admitting: Family

## 2023-04-02 NOTE — Telephone Encounter (Signed)
Pt called back returning Lindsay's call regarding results. Told pt Dugal's response, pt had no questions/concerns. Call back # [phone]

## 2023-04-16 ENCOUNTER — Other Ambulatory Visit: Payer: Self-pay

## 2023-04-25 ENCOUNTER — Ambulatory Visit (HOSPITAL_COMMUNITY): Admission: RE | Admit: 2023-04-25 | Payer: Medicare HMO | Source: Ambulatory Visit

## 2023-05-13 ENCOUNTER — Ambulatory Visit (HOSPITAL_COMMUNITY): Admission: RE | Admit: 2023-05-13 | Payer: Medicare HMO | Source: Ambulatory Visit

## 2023-07-03 DIAGNOSIS — H524 Presbyopia: Secondary | ICD-10-CM | POA: Diagnosis not present

## 2023-07-03 DIAGNOSIS — H52223 Regular astigmatism, bilateral: Secondary | ICD-10-CM | POA: Diagnosis not present

## 2023-07-03 DIAGNOSIS — H2513 Age-related nuclear cataract, bilateral: Secondary | ICD-10-CM | POA: Diagnosis not present

## 2023-07-03 DIAGNOSIS — H4322 Crystalline deposits in vitreous body, left eye: Secondary | ICD-10-CM | POA: Diagnosis not present

## 2023-07-03 DIAGNOSIS — H5203 Hypermetropia, bilateral: Secondary | ICD-10-CM | POA: Diagnosis not present

## 2023-07-03 DIAGNOSIS — H25033 Anterior subcapsular polar age-related cataract, bilateral: Secondary | ICD-10-CM | POA: Diagnosis not present

## 2023-09-05 ENCOUNTER — Other Ambulatory Visit: Payer: Self-pay | Admitting: *Deleted

## 2023-09-05 DIAGNOSIS — E782 Mixed hyperlipidemia: Secondary | ICD-10-CM

## 2023-09-05 MED ORDER — ATORVASTATIN CALCIUM 20 MG PO TABS: 20.0000 mg | ORAL_TABLET | Freq: Every day | ORAL | 3 refills | Status: AC

## 2023-10-17 ENCOUNTER — Other Ambulatory Visit: Payer: Self-pay | Admitting: Dermatology

## 2024-03-04 ENCOUNTER — Ambulatory Visit: Payer: Medicare HMO | Admitting: Dermatology

## 2024-03-04 ENCOUNTER — Encounter: Payer: Self-pay | Admitting: Dermatology

## 2024-03-04 DIAGNOSIS — L2089 Other atopic dermatitis: Secondary | ICD-10-CM | POA: Diagnosis not present

## 2024-03-04 DIAGNOSIS — Z79899 Other long term (current) drug therapy: Secondary | ICD-10-CM

## 2024-03-04 DIAGNOSIS — Z7189 Other specified counseling: Secondary | ICD-10-CM

## 2024-03-04 MED ORDER — TACROLIMUS 0.1 % EX OINT: TOPICAL_OINTMENT | Freq: Every day | CUTANEOUS | 5 refills | Status: AC

## 2024-03-04 MED ORDER — MOMETASONE FUROATE 0.1 % EX CREA: 1.0000 | TOPICAL_CREAM | CUTANEOUS | 5 refills | Status: AC

## 2024-03-04 NOTE — Patient Instructions (Addendum)
 Continue tacrolimus  ointment once a day to aa eczema, twice daily with flares.  Continue mometasone  cream once to twice a day Friday, Saturday, Sunday.  Due to recent changes in healthcare laws, you may see results of your pathology and/or laboratory studies on MyChart before the doctors have had a chance to review them. We understand that in some cases there may be results that are confusing or concerning to you. Please understand that not all results are received at the same time and often the doctors may need to interpret multiple results in order to provide you with the best plan of care or course of treatment. Therefore, we ask that you please give us  2 business days to thoroughly review all your results before contacting the office for clarification. Should we see a critical lab result, you will be contacted sooner.   If You Need Anything After Your Visit  If you have any questions or concerns for your doctor, please call our main line at [phone] and press option 4 to reach your doctor's medical assistant. If no one answers, please leave a voicemail as directed and we will return your call as soon as possible. Messages left after 4 pm will be answered the following business day.   You may also send us  a message via MyChart. We typically respond to MyChart messages within 1-2 business days.  For prescription refills, please ask your pharmacy to contact our office. Our fax number is [phone].  If you have an urgent issue when the clinic is closed that cannot wait until the next business day, you can page your doctor at the number below.    Please note that while we do our best to be available for urgent issues outside of office hours, we are not available 24/7.   If you have an urgent issue and are unable to reach us , you may choose to seek medical care at your doctor's office, retail clinic, urgent care center, or emergency room.  If you have a medical emergency, please immediately  call 911 or go to the emergency department.  Pager Numbers  - Dr. Hester: [phone]  - Dr. Jackquline: [phone]  - Dr. Claudene: [phone]   - Dr. Raymund: [phone]  In the event of inclement weather, please call our main line at [phone] for an update on the status of any delays or closures.  Dermatology Medication Tips: Please keep the boxes that topical medications come in in order to help keep track of the instructions about where and how to use these. Pharmacies typically print the medication instructions only on the boxes and not directly on the medication tubes.   If your medication is too expensive, please contact our office at [phone] option 4 or send us  a message through MyChart.   We are unable to tell what your co-pay for medications will be in advance as this is different depending on your insurance coverage. However, we may be able to find a substitute medication at lower cost or fill out paperwork to get insurance to cover a needed medication.   If a prior authorization is required to get your medication covered by your insurance company, please allow us  1-2 business days to complete this process.  Drug prices often vary depending on where the prescription is filled and some pharmacies may offer cheaper prices.  The website www.goodrx.com contains coupons for medications through different pharmacies. The prices here do not account for what the cost may be with help from insurance (it may  be cheaper with your insurance), but the website can give you the price if you did not use any insurance.  - You can print the associated coupon and take it with your prescription to the pharmacy.  - You may also stop by our office during regular business hours and pick up a GoodRx coupon card.  - If you need your prescription sent electronically to a different pharmacy, notify our office through [facility] or by phone at [phone] option 4.     Si Usted  Necesita Algo Despus de Su Visita  Tambin puede enviarnos un mensaje a travs de Clinical Cytogeneticist. Por lo general respondemos a los mensajes de MyChart en el transcurso de 1 a 2 das hbiles.  Para renovar recetas, por favor pida a su farmacia que se ponga en contacto con nuestra oficina. Randi lakes de fax es [place] [phone].  Si tiene un asunto urgente cuando la clnica est cerrada y que no puede esperar hasta el siguiente da hbil, puede llamar/localizar a su doctor(a) al nmero que aparece a continuacin.   Por favor, tenga en cuenta que aunque hacemos todo lo posible para estar disponibles para asuntos urgentes fuera del horario de [place], no estamos disponibles las 24 horas del da, los 7 [address]  [address] de la [place].   Si tiene un problema urgente y no puede comunicarse con nosotros, puede optar por buscar atencin mdica  en el consultorio de su doctor(a), en una clnica privada, en un centro de atencin urgente o en una sala de emergencias.  Si tiene engineer, drilling, por favor llame inmediatamente al 911 o vaya a la sala de emergencias.  Nmeros de bper  - Dr. Hester: [phone]  - Dra. Jackquline: [phone]  - Dr. Claudene: [phone]  - Dra. Kitts: [phone]  En caso de inclemencias del [place], por favor llame a nuestra lnea principal al [phone] para una actualizacin sobre el estado de cualquier retraso o cierre.  Consejos para la medicacin en dermatologa: Por favor, guarde las cajas en las que vienen los medicamentos de uso tpico para ayudarle a seguir las instrucciones sobre dnde y cmo usarlos. Las farmacias generalmente imprimen las instrucciones del medicamento slo en las cajas y no directamente en los tubos del [place].   Si su medicamento es muy caro, por favor, pngase en contacto con landry rieger llamando al [phone] y presione la opcin 4 o envenos un mensaje a travs de Clinical Cytogeneticist.   No podemos decirle cul ser su copago por los medicamentos  por adelantado ya que esto es diferente dependiendo de la cobertura de su seguro. Sin embargo, es posible que podamos encontrar un medicamento sustituto a audiological scientist un formulario para que el seguro cubra el medicamento que se considera necesario.   Si se requiere una autorizacin previa para que su compaa de seguros cubra su medicamento, por favor permtanos de 1 a 2 das hbiles para completar este proceso.  Los precios de los medicamentos varan con frecuencia dependiendo del environmental consultant de dnde se surte la receta y alguna farmacias pueden ofrecer precios ms baratos.  El sitio web www.goodrx.com tiene cupones para medicamentos de health and safety inspector. Los precios aqu no tienen en cuenta lo que podra costar con la ayuda del seguro (puede ser ms barato con su seguro), pero el sitio web puede darle el precio si no utiliz tourist information centre manager.  - Puede imprimir el cupn correspondiente y llevarlo con su receta a la farmacia.  - Tambin puede pasar por nuestra oficina durante el  horario de visual merchandiser regular y education officer, museum una tarjeta de cupones de GoodRx.  - Si necesita que su receta se enve electrnicamente a una farmacia diferente, informe a nuestra oficina a travs de MyChart de [place] o por telfono llamando al [phone] y presione la opcin 4.

## 2024-03-04 NOTE — Progress Notes (Signed)
   Follow-Up Visit   Subjective  Patrick Thompson is a 73 y.o. male who presents for the following: Atopic Dermatitis at legs  1 year follow up. Patient currently using mometasone  at lower legs for flares, also uses tacrolimus . Improved per patient. Pimecrolimus  not covered.   The following portions of the chart were reviewed this encounter and updated as appropriate: medications, allergies, medical history  Review of Systems:  No other skin or systemic complaints except as noted in HPI or Assessment and Plan.  Objective  Well appearing patient in no apparent distress; mood and affect are within normal limits.  Areas Examined: Legs, arms  Relevant physical exam findings are noted in the Assessment and Plan.    Assessment & Plan  OTHER ATOPIC DERMATITIS   COUNSELING AND COORDINATION OF CARE   MEDICATION MANAGEMENT    ATOPIC DERMATITIS Exam: pinkness at left pretibia, right pretibia and arms clear 5% BSA Chronic and persistent condition with duration or expected duration over one year. Condition is improving with treatment but not currently at goal. Atopic dermatitis (eczema) is a chronic, relapsing, pruritic condition that can significantly affect quality of life. It is often associated with allergic rhinitis and/or asthma and can require treatment with topical medications, phototherapy, or in severe cases biologic injectable medication (Dupixent; Adbry) or Oral JAK inhibitors. Treatment Plan: Continue tacrolimus  ointment once a day to aa eczema, twice daily to any active eczema areas of skin Continue mometasone  cream once to twice a day Friday, Saturday, Sunday prn.  Recommend gentle skin care.  Long term medication management.  Patient is using long term (months to years) prescription medication  to control their dermatologic condition.  These medications require periodic monitoring to evaluate for efficacy and side effects and may require periodic laboratory  monitoring.  Return in about 6 months (around 09/02/2024) for Atopic Dermatitis, with Dr. LOIS LILLETTE Lonell Lorren, RMA, am acting as scribe for Alm Rhyme, MD .   Documentation: I have reviewed the above documentation for accuracy and completeness, and I agree with the above.  Alm Rhyme, MD

## 2024-03-10 ENCOUNTER — Encounter: Payer: Self-pay | Admitting: Dermatology

## 2024-03-23 ENCOUNTER — Ambulatory Visit: Payer: Medicare HMO

## 2024-03-23 VITALS — BP 132/76 | Ht 71.0 in | Wt 200.0 lb

## 2024-03-23 DIAGNOSIS — Z0000 Encounter for general adult medical examination without abnormal findings: Secondary | ICD-10-CM

## 2024-03-23 NOTE — Progress Notes (Signed)
 I connected with  Patrick Thompson on 03/23/24 by a audio enabled telemedicine application and verified that I am speaking with the correct person using two identifiers.  Patient Location: Home  Provider Location: Home Office  Persons Participating in Visit: Patient.  I discussed the limitations of evaluation and management by telemedicine. The patient expressed understanding and agreed to proceed.  Vital Signs: Because this visit was a virtual/telehealth visit, some criteria may be missing or patient reported. Any vitals not documented were not able to be obtained and vitals that have been documented are patient reported.   Because this visit was a virtual/telehealth visit,  certain criteria was not obtained, such a blood pressure, CBG if applicable, and timed get up and go. Any medications not marked as taking were not mentioned during the medication reconciliation part of the visit. Any vitals not documented were not able to be obtained due to this being a telehealth visit or patient was unable to self-report a recent blood pressure reading due to a lack of equipment at home via telehealth. Vitals that have been documented are verbally provided by the patient.   This visit was performed by a medical professional under my direct supervision. I was immediately available for consultation/collaboration. I have reviewed and agree with the Annual Wellness Visit documentation.  Chief Complaint  Patient presents with   Medicare Wellness     Subjective:   Patrick Thompson is a 73 y.o. male who presents for a Medicare Annual Wellness Visit.  Allergies (verified) Bee venom   History: Past Medical History:  Diagnosis Date   Allergy    anaphylaxis with bee sting   BPH (benign prostatic hyperplasia)    2018, pt does not recollect this   Elevated blood pressure reading 05/17/2021   Elevated at visit 1/11 D/w pt blood pressure record and f/u in three weeks   HNP (herniated nucleus pulposus),  lumbar 04/23/2017   Hypertension    No longer on medication   Prediabetes    Syncope 10/20/2015   Tobacco abuse    quit 2022   Past Surgical History:  Procedure Laterality Date   LUMBAR LAMINECTOMY/DECOMPRESSION MICRODISCECTOMY Bilateral 04/23/2017   Procedure: L4-5 microdiscectomy;  Surgeon: Onetha Kuba, MD;  Location: [facility] OR;  Service: Neurosurgery;  Laterality: Bilateral;   Family History  Problem Relation Age of Onset   Hypertension Mother    Diabetes Mother    Cancer Father        Prostate    Colon cancer Father    Heart disease Brother        Half brother, MI in late 84s   Esophageal cancer Neg Hx    Rectal cancer Neg Hx    Stomach cancer Neg Hx    Social History   Occupational History   Occupation: retired  Tobacco Use   Smoking status: Former    Current packs/day: 0.00    Average packs/day: 0.1 packs/day for 40.0 years (4.0 ttl pk-yrs)    Types: Cigarettes    Start date: 39    Quit date: 2022    Years since quitting: 3.8   Smokeless tobacco: Never  Vaping Use   Vaping status: Never Used  Substance and Sexual Activity   Alcohol use: Not Currently    Comment: quit june 2018   Drug use: No   Sexual activity: Yes    Partners: Female   Tobacco Counseling Counseling given: Not Answered  SDOH Screenings   Food Insecurity: No Food Insecurity (03/23/2024)  Housing: Low  Risk  (03/23/2024)  Transportation Needs: No Transportation Needs (03/23/2024)  Utilities: Not At Risk (03/23/2024)  Depression (PHQ2-9): Low Risk  (03/23/2024)  Financial Resource Strain: Low Risk  (03/21/2023)  Physical Activity: Sufficiently Active (03/23/2024)  Social Connections: Moderately Isolated (03/23/2024)  Stress: No Stress Concern Present (03/23/2024)  Tobacco Use: Medium Risk (03/23/2024)  Health Literacy: Adequate Health Literacy (03/23/2024)   See flowsheets for full screening details  Depression Screen PHQ 2 & 9 Depression Scale- Over the past 2 weeks, how often have  you been bothered by any of the following problems? Little interest or pleasure in doing things: 0 Feeling down, depressed, or hopeless (PHQ Adolescent also includes...irritable): 0 PHQ-2 Total Score: 0 Trouble falling or staying asleep, or sleeping too much: 0 Feeling tired or having little energy: 0 Poor appetite or overeating (PHQ Adolescent also includes...weight loss): 0 Feeling bad about yourself - or that you are a failure or have let yourself or your family down: 0 Trouble concentrating on things, such as reading the newspaper or watching television (PHQ Adolescent also includes...like school work): 0 Moving or speaking so slowly that other people could have noticed. Or the opposite - being so fidgety or restless that you have been moving around a lot more than usual: 0 Thoughts that you would be better off dead, or of hurting yourself in some way: 0 PHQ-9 Total Score: 0 If you checked off any problems, how difficult have these problems made it for you to do your work, take care of things at home, or get along with other people?: Not difficult at all  Depression Treatment Depression Interventions/Treatment : EYV7-0 Score <4 Follow-up Not Indicated     Goals Addressed               This Visit's Progress     Maintain healthy lifestyle. (pt-stated)   On track     Stay active Healthy diet Good water intake       Visit info / Clinical Intake: Medicare Wellness Visit Type:: Subsequent Annual Wellness Visit Persons participating in visit:: patient Medicare Wellness Visit Mode:: Telephone If telephone:: video declined Because this visit was a virtual/telehealth visit:: pt reported vitals If Telephone or Video please confirm:: I connected with the patient using audio enabled telemedicine application and verified that I am speaking with the correct person using two identifiers; I discussed the limitations of evaluation and management by telemedicine; The patient expressed  understanding and agreed to proceed Patient Location:: home Provider Location:: home office Information given by:: patient Interpreter Needed?: No Pre-visit prep was completed: yes AWV questionnaire completed by patient prior to visit?: no Living arrangements:: (!) lives alone Patient's Overall Health Status Rating: very good Typical amount of pain: none Does pain affect daily life?: no Are you currently prescribed opioids?: no  Dietary Habits and Nutritional Risks How many meals a day?: 3 Eats fruit and vegetables daily?: yes Most meals are obtained by: preparing own meals; eating out In the last 2 weeks, have you had any of the following?: none Diabetic:: no  Functional Status Activities of Daily Living (to include ambulation/medication): Independent Ambulation: Independent Medication Administration: Independent Home Management: Independent Manage your own finances?: yes Primary transportation is: driving Concerns about vision?: no *vision screening is required for WTM* Concerns about hearing?: no  Fall Screening Falls in the past year?: 0 Number of falls in past year: 0 Was there an injury with Fall?: 0 Fall Risk Category Calculator: 0 Patient Fall Risk Level: Low Fall Risk  Fall Risk Patient at Risk for Falls Due to: No Fall Risks Fall risk Follow up: Falls evaluation completed; Falls prevention discussed  Home and Transportation Safety: All rugs have non-skid backing?: N/A, no rugs All stairs or steps have railings?: N/A, no stairs Grab bars in the bathtub or shower?: yes Have non-skid surface in bathtub or shower?: yes Good home lighting?: yes Regular seat belt use?: yes Hospital stays in the last year:: no  Cognitive Assessment Difficulty concentrating, remembering, or making decisions? : no Will 6CIT or Mini Cog be Completed: yes 6CIT or Mini Cog Declined: patient alert, oriented, able to answer questions appropriately and recall recent events What year  is it?: 0 points What month is it?: 0 points Give patient an address phrase to remember (5 components): remember words apple, table, penny About what time is it?: 0 points Count backwards from 20 to 1: 0 points Say the months of the year in reverse: 0 points Repeat the address phrase from earlier: 0 points 6 CIT Score: 0 points  Advance Directives (For Healthcare) Does Patient Have a Medical Advance Directive?: No Would patient like information on creating a medical advance directive?: No - Patient declined  Reviewed/Updated  Reviewed/Updated: Reviewed All (Medical, Surgical, Family, Medications, Allergies, Care Teams, Patient Goals)        Objective:    Today's Vitals   03/23/24 1012  BP: 132/76  Weight: 200 lb (90.7 kg)  Height: 5' 11 (1.803 m)   Body mass index is 27.89 kg/m.  Current Medications (verified) Outpatient Encounter Medications as of 03/23/2024  Medication Sig   atorvastatin  (LIPITOR) 20 MG tablet Take 1 tablet (20 mg total) by mouth daily.   ciclopirox  (LOPROX ) 0.77 % cream Apply topically 2 (two) times daily.   EPINEPHrine  0.3 mg/0.3 mL IJ SOAJ injection INJECT 1 PEN INTO THE MUSCLE AS DIRECTED FOR SEVERE ALLERGIC REACTION, THEN GO TO ER   mometasone  (ELOCON ) 0.1 % cream Apply 1 Application topically as directed. Apply to areas of eczema when flared once daily only 3 days a week, Friday, Saturday, Sunday   nystatin  (MYCOSTATIN ) 100000 UNIT/ML suspension Take 5 mLs (500,000 Units total) by mouth 4 (four) times daily.   pimecrolimus  (ELIDEL ) 1 % cream Apply to affected areas rash on legs once a day, increasing to twice daily with flares.   tacrolimus  (PROTOPIC ) 0.1 % ointment Apply topically daily. Qd to eczema on arms and legs. May increase to twice daily with flares.   No facility-administered encounter medications on file as of 03/23/2024.   Hearing/Vision screen Hearing Screening - Comments:: No difficulties  Vision Screening - Comments:: Wears  readers Immunizations and Health Maintenance Health Maintenance  Topic Date Due   DTaP/Tdap/Td (1 - Tdap) Never done   Colonoscopy  05/27/2021   Influenza Vaccine  Never done   Medicare Annual Wellness (AWV)  03/20/2024   Pneumococcal Vaccine: 50+ Years  Completed   Hepatitis C Screening  Completed   Meningococcal B Vaccine  Aged Out   COVID-19 Vaccine  Discontinued   Zoster Vaccines- Shingrix  Discontinued        Assessment/Plan:  This is a routine wellness examination for Hendricks.  Patient Care Team: Corwin Antu, FNP as PCP - General (Family Medicine) Portia Fireman, OD as Consulting Physician (Optometry)  I have personally reviewed and noted the following in the patient's chart:   Medical and social history Use of alcohol, tobacco or illicit drugs  Current medications and supplements including opioid prescriptions. Functional ability and status  Nutritional status Physical activity Advanced directives List of other physicians Hospitalizations, surgeries, and ER visits in previous 12 months Vitals Screenings to include cognitive, depression, and falls Referrals and appointments  No orders of the defined types were placed in this encounter.  In addition, I have reviewed and discussed with patient certain preventive protocols, quality metrics, and best practice recommendations. A written personalized care plan for preventive services as well as general preventive health recommendations were provided to patient.   Lyle MARLA Right, [place]   03/23/2024   No follow-ups on file.  After Visit Summary: (MyChart) Due to this being a telephonic visit, the after visit summary with patients personalized plan was offered to patient via MyChart   Nurse Notes: Nothing to report

## 2024-03-23 NOTE — Patient Instructions (Signed)
 Patrick Thompson,  Thank you for taking the time for your Medicare Wellness Visit. I appreciate your continued commitment to your health goals. Please review the care plan we discussed, and feel free to reach out if I can assist you further.  Please note that Annual Wellness Visits do not include a physical exam. Some assessments may be limited, especially if the visit was conducted virtually. If needed, we may recommend an in-person follow-up with your provider.  Ongoing Care Seeing your primary care provider every 3 to 6 months helps us  monitor your health and provide consistent, personalized care.   Referrals If a referral was made during today's visit and you haven't received any updates within two weeks, please contact the referred provider directly to check on the status.  Recommended Screenings:  Health Maintenance  Topic Date Due   DTaP/Tdap/Td vaccine (1 - Tdap) Never done   Colon Cancer Screening  05/27/2021   Flu Shot  Never done   Medicare Annual Wellness Visit  03/20/2024   Pneumococcal Vaccine for age over 54  Completed   Hepatitis C Screening  Completed   Meningitis B Vaccine  Aged Out   COVID-19 Vaccine  Discontinued   Zoster (Shingles) Vaccine  Discontinued       03/23/2024   10:14 AM  Advanced Directives  Does Patient Have a Medical Advance Directive? No  Would patient like information on creating a medical advance directive? No - Patient declined    Vision: Annual vision screenings are recommended for early detection of glaucoma, cataracts, and diabetic retinopathy. These exams can also reveal signs of chronic conditions such as diabetes and high blood pressure.  Dental: Annual dental screenings help detect early signs of oral cancer, gum disease, and other conditions linked to overall health, including heart disease and diabetes.  Please see the attached documents for additional preventive care recommendations.

## 2024-07-20 ENCOUNTER — Other Ambulatory Visit

## 2024-07-27 ENCOUNTER — Encounter: Admitting: Family

## 2024-09-02 ENCOUNTER — Ambulatory Visit: Admitting: Dermatology
